# Patient Record
Sex: Male | Born: 1975 | Hispanic: Yes | Marital: Married | Smoking: Current some day smoker
Health system: Southern US, Community
[De-identification: ages and names within clinical notes are randomized; demographics above are authoritative.]

## PROBLEM LIST (undated history)

## (undated) ENCOUNTER — Ambulatory Visit: Payer: Self-pay

---

## 2009-12-21 HISTORY — PX: CHOLECYSTECTOMY: SHX55

## 2011-09-02 ENCOUNTER — Emergency Department: Payer: Self-pay | Admitting: Emergency Medicine

## 2013-11-13 ENCOUNTER — Emergency Department: Payer: Self-pay | Admitting: Emergency Medicine

## 2013-11-13 LAB — COMPREHENSIVE METABOLIC PANEL
Albumin: 3.5 g/dL (ref 3.4–5.0)
Alkaline Phosphatase: 93 U/L
Anion Gap: 1 — ABNORMAL LOW (ref 7–16)
BUN: 19 mg/dL — ABNORMAL HIGH (ref 7–18)
Bilirubin,Total: 0.4 mg/dL (ref 0.2–1.0)
Calcium, Total: 9.2 mg/dL (ref 8.5–10.1)
Chloride: 104 mmol/L (ref 98–107)
Creatinine: 1.07 mg/dL (ref 0.60–1.30)
EGFR (African American): >60
EGFR (Non-African Amer.): >60
Glucose: 90 mg/dL (ref 65–99)
Osmolality: 276 (ref 275–301)
Potassium: 3.9 mmol/L (ref 3.5–5.1)
Sodium: 137 mmol/L (ref 136–145)
Total Protein: 8 g/dL (ref 6.4–8.2)

## 2013-11-13 LAB — CBC
HCT: 44.4 % (ref 40.0–52.0)
MCHC: 33.8 g/dL (ref 32.0–36.0)
MCV: 89 fL (ref 80–100)
Platelet: 316 10*3/uL (ref 150–440)
WBC: 10.2 10*3/uL (ref 3.8–10.6)

## 2015-04-12 ENCOUNTER — Emergency Department
Admit: 2015-04-12 | Disposition: A | Discharge status: Home / Self Care | Payer: Self-pay | Admitting: Emergency Medicine

## 2015-08-09 ENCOUNTER — Emergency Department
Admission: EM | Admit: 2015-08-09 | Discharge: 2015-08-09 | Disposition: A | Discharge status: Home / Self Care | Payer: Self-pay | Attending: Emergency Medicine | Admitting: Emergency Medicine

## 2015-08-09 ENCOUNTER — Other Ambulatory Visit: Payer: Self-pay

## 2015-08-09 ENCOUNTER — Encounter: Payer: Self-pay | Admitting: Emergency Medicine

## 2015-08-09 DIAGNOSIS — R0602 Shortness of breath: Secondary | ICD-10-CM | POA: Insufficient documentation

## 2015-08-09 DIAGNOSIS — E869 Volume depletion, unspecified: Secondary | ICD-10-CM | POA: Insufficient documentation

## 2015-08-09 DIAGNOSIS — Z87891 Personal history of nicotine dependence: Secondary | ICD-10-CM | POA: Insufficient documentation

## 2015-08-09 DIAGNOSIS — R001 Bradycardia, unspecified: Secondary | ICD-10-CM | POA: Insufficient documentation

## 2015-08-09 DIAGNOSIS — R42 Dizziness and giddiness: Secondary | ICD-10-CM | POA: Insufficient documentation

## 2015-08-09 LAB — CBC WITH DIFFERENTIAL/PLATELET
BASOS ABS: 0 10*3/uL (ref 0–0.1)
Basophils Relative: 1 %
EOS ABS: 0.2 10*3/uL (ref 0–0.7)
EOS PCT: 4 %
HCT: 45.2 % (ref 40.0–52.0)
Hemoglobin: 15.2 g/dL (ref 13.0–18.0)
LYMPHS PCT: 26 %
Lymphs Abs: 1.5 10*3/uL (ref 1.0–3.6)
MCH: 30.6 pg (ref 26.0–34.0)
MCHC: 33.6 g/dL (ref 32.0–36.0)
MCV: 90.9 fL (ref 80.0–100.0)
MONO ABS: 0.5 10*3/uL (ref 0.2–1.0)
Monocytes Relative: 8 %
Neutro Abs: 3.6 10*3/uL (ref 1.4–6.5)
Neutrophils Relative %: 61 %
PLATELETS: 232 10*3/uL (ref 150–440)
RBC: 4.97 MIL/uL (ref 4.40–5.90)
RDW: 13.4 % (ref 11.5–14.5)
WBC: 5.8 10*3/uL (ref 3.8–10.6)

## 2015-08-09 LAB — URINALYSIS COMPLETE WITH MICROSCOPIC (ARMC ONLY)
BACTERIA UA: NONE SEEN
Bilirubin Urine: NEGATIVE
GLUCOSE, UA: NEGATIVE mg/dL
Hgb urine dipstick: NEGATIVE
Ketones, ur: NEGATIVE mg/dL
Leukocytes, UA: NEGATIVE
NITRITE: NEGATIVE
Protein, ur: NEGATIVE mg/dL
SQUAMOUS EPITHELIAL / LPF: NONE SEEN
Specific Gravity, Urine: 1.018 (ref 1.005–1.030)
pH: 5 (ref 5.0–8.0)

## 2015-08-09 LAB — BASIC METABOLIC PANEL
ANION GAP: 6 (ref 5–15)
BUN: 14 mg/dL (ref 6–20)
CALCIUM: 9.2 mg/dL (ref 8.9–10.3)
CO2: 28 mmol/L (ref 22–32)
Chloride: 105 mmol/L (ref 101–111)
Creatinine, Ser: 0.96 mg/dL (ref 0.61–1.24)
GFR calc Af Amer: >60 mL/min (ref >60)
GLUCOSE: 95 mg/dL (ref 65–99)
Potassium: 4.2 mmol/L (ref 3.5–5.1)
SODIUM: 139 mmol/L (ref 135–145)

## 2015-08-09 LAB — TROPONIN I

## 2015-08-09 LAB — CK: CK TOTAL: 103 U/L (ref 49–397)

## 2015-08-09 MED ORDER — SODIUM CHLORIDE 0.9 % IV BOLUS (SEPSIS)
1000.0000 mL | INTRAVENOUS | Status: AC
  Administered 2015-08-09: 1000 mL via INTRAVENOUS

## 2015-08-09 NOTE — Discharge Instructions (Signed)
As we discussed, we believe her symptoms are caused today by mild volume depletion, or mild dehydration, without any evidence of damage to your body.  Please drink plenty of clear fluids such as water and/or Gatorade and follow up with your regular doctor or the doctors listed in his documentation at the next available opportunity.  Return to the emergency department with any new or worsening symptoms that concern you, including but not limited to fever, shortness of breath, chest pain, or other concerning symptoms. ° ° °Dehydration, Adult °Dehydration is when you lose more fluids from the body than you take in. Vital organs like the kidneys, brain, and heart cannot function without a proper amount of fluids and salt. Any loss of fluids from the body can cause dehydration.  °CAUSES  °Vomiting. °Diarrhea. °Excessive sweating. °Excessive urine output. °Fever. °SYMPTOMS  °Mild dehydration °Thirst. °Dry lips. °Slightly dry mouth. °Moderate dehydration °Very dry mouth. °Sunken eyes. °Skin does not bounce back quickly when lightly pinched and released. °Dark urine and decreased urine production. °Decreased tear production. °Headache. °Severe dehydration °Very dry mouth. °Extreme thirst. °Rapid, weak pulse (more than 100 beats per minute at rest). °Cold hands and feet. °Not able to sweat in spite of heat and temperature. °Rapid breathing. °Blue lips. °Confusion and lethargy. °Difficulty being awakened. °Minimal urine production. °No tears. °DIAGNOSIS  °Your caregiver will diagnose dehydration based on your symptoms and your exam. Blood and urine tests will help confirm the diagnosis. The diagnostic evaluation should also identify the cause of dehydration. °TREATMENT  °Treatment of mild or moderate dehydration can often be done at home by increasing the amount of fluids that you drink. It is best to drink small amounts of fluid more often. Drinking too much at one time can make vomiting worse. Refer to the home care  instructions below. °Severe dehydration needs to be treated at the hospital where you will probably be given intravenous (IV) fluids that contain water and electrolytes. °HOME CARE INSTRUCTIONS  °Ask your caregiver about specific rehydration instructions. °Drink enough fluids to keep your urine clear or pale yellow. °Drink small amounts frequently if you have nausea and vomiting. °Eat as you normally do. °Avoid: °Foods or drinks high in sugar. °Carbonated drinks. °Juice. °Extremely hot or cold fluids. °Drinks with caffeine. °Fatty, greasy foods. °Alcohol. °Tobacco. °Overeating. °Gelatin desserts. °Wash your hands well to avoid spreading bacteria and viruses. °Only take over-the-counter or prescription medicines for pain, discomfort, or fever as directed by your caregiver. °Ask your caregiver if you should continue all prescribed and over-the-counter medicines. °Keep all follow-up appointments with your caregiver. °SEEK MEDICAL CARE IF: °You have abdominal pain and it increases or stays in one area (localizes). °You have a rash, stiff neck, or severe headache. °You are irritable, sleepy, or difficult to awaken. °You are weak, dizzy, or extremely thirsty. °SEEK IMMEDIATE MEDICAL CARE IF:  °You are unable to keep fluids down or you get worse despite treatment. °You have frequent episodes of vomiting or diarrhea. °You have blood or green matter (bile) in your vomit. °You have blood in your stool or your stool looks black and tarry. °You have not urinated in 6 to 8 hours, or you have only urinated a small amount of very dark urine. °You have a fever. °You faint. °MAKE SURE YOU:  °Understand these instructions. °Will watch your condition. °Will get help right away if you are not doing well or get worse. °Document Released: 12/07/2005 Document Revised: 02/29/2012 Document Reviewed: 07/27/2011 °ExitCare® Patient Information ©2015   [employer]. This information is not intended to replace advice given to you by your health  care provider. Make sure you discuss any questions you have with your health care provider. ° °Rehydration, Adult °Rehydration is the replacement of body fluids lost during dehydration. Dehydration is an extreme loss of body fluids to the point of body function impairment. There are many ways extreme fluid loss can occur, including vomiting, diarrhea, or excess sweating. Recovering from dehydration requires replacing lost fluids, continuing to eat to maintain strength, and avoiding foods and beverages that may contribute to further fluid loss or may increase nausea. °HOW TO REHYDRATE °In most cases, rehydration involves the replacement of not only fluids but also carbohydrates and basic body salts. Rehydration with an oral rehydration solution is one way to replace essential nutrients lost through dehydration. °An oral rehydration solution can be purchased at pharmacies, retail stores, and online. Premixed packets of powder that you combine with water to make a solution are also sold. You can prepare an oral rehydration solution at home by mixing the following ingredients together:  ° - tsp table salt. °¾ tsp baking soda. ° tsp salt substitute containing potassium chloride. °1 tablespoons sugar. °1 L (34 oz) of water. °Be sure to use exact measurements. Including too much sugar can make diarrhea worse. °Drink ½-1 cup (120-240 mL) of oral rehydration solution each time you have diarrhea or vomit. If drinking this amount makes your vomiting worse, try drinking smaller amounts more often. For example, drink 1-3 tsp every 5-10 minutes.  °A general rule for staying hydrated is to drink 1½-2 L of fluid per day. Talk to your caregiver about the specific amount you should be drinking each day. Drink enough fluids to keep your urine clear or pale yellow. °EATING WHEN DEHYDRATED °Even if you have had severe sweating or you are having diarrhea, do not stop eating. Many healthy items in a normal diet are okay to continue eating  while recovering from dehydration. The following tips can help you to lessen nausea when you eat: °Ask someone else to prepare your food. Cooking smells may worsen nausea. °Eat in a well-ventilated room away from cooking smells. °Sit up when you eat. Avoid lying down until 1-2 hours after eating. °Eat small amounts when you eat. °Eat foods that are easy to digest. These include soft, well-cooked, or mashed foods. °FOODS AND BEVERAGES TO AVOID °Avoid eating or drinking the following foods and beverages that may increase nausea or further loss of fluid:  °Fruit juices with a high sugar content, such as concentrated juices. °Alcohol. °Beverages containing caffeine. °Carbonated drinks. They may cause a lot of gas. °Foods that may cause a lot of gas, such as cabbage, broccoli, and beans. °Fatty, greasy, and fried foods. °Spicy, very salty, and very sweet foods or drinks. °Foods or drinks that are very hot or very cold. Consume food or drinks at or near room temperature. °Foods that need a lot of chewing, such as raw vegetables. °Foods that are sticky or hard to swallow, such as peanut butter. °Document Released: 02/29/2012 Document Revised: 08/31/2012 Document Reviewed: 02/29/2012 °ExitCare® Patient Information ©2015 [employer]. This information is not intended to replace advice given to you by your health care provider. Make sure you discuss any questions you have with your health care provider. ° ° ° °

## 2015-08-09 NOTE — ED Notes (Signed)
Pt presents with feelings of dizziness. Thinks he might be dehydrated, he works outside in [employer].

## 2015-08-09 NOTE — ED Provider Notes (Signed)
[facility] Emergency Department Provider Note  ____________________________________________  Time seen: Approximately 8:10 AM  I have reviewed the triage vital signs and the nursing notes.   HISTORY  Chief Complaint Dizziness    HPI Scott Winters is a 39 y.o. male with no chronic medical problems who presents with several days of intermittent lightheadedness/dizziness, a near syncopal episode 3 days ago, and general fatigue.  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  He has never had these kinds of symptoms before but associates it with his work even though he tries to drink plenty of water while he is outside.  He denies any acute muscle cramps but does state that he has had some pain in his right upper quadrant intermittently.  However, he has had this pain since having his gallbladder out in 2011 and states that it is not anything new.  Hedenies headache, neck pain, chest pain, abdominal pain, vomiting, diarrhea.  Associated with the lightheadedness he has had some mild shortness of breath while outside in the heat and working.  He has had no numbness or weakness in any of his extremities.   History reviewed. No pertinent past medical history.  There are no active problems to display for this patient.   Past Surgical History  Procedure Laterality Date  . Cholecystectomy  2011    No current outpatient prescriptions on file.  Allergies Review of patient's allergies indicates no known allergies.  History reviewed. No pertinent family history.  Social History Social History  Substance Use Topics  . Smoking status: Former Games developer  . Smokeless tobacco: None  . Alcohol Use: Yes    Review of Systems Constitutional: No fever/chills Eyes: No visual changes. ENT: No sore throat. Cardiovascular: Denies chest pain.  One episode of nearly passing out 3 days ago Respiratory: Shortness of breath  associated with the lightheadedness Gastrointestinal: No abdominal pain.  No nausea, no vomiting.  No diarrhea.  No constipation. Genitourinary: Negative for dysuria. Musculoskeletal: Negative for back pain. Skin: Negative for rash. Neurological: Negative for headaches, focal weakness or numbness.  One episode of nearly passing out 3 days ago  10-point ROS otherwise negative.  ____________________________________________   PHYSICAL EXAM:  VITAL SIGNS: ED Triage Vitals  Enc Vitals Group     BP 08/09/15 0709 120/84 mmHg     Pulse Rate 08/09/15 0709 71     Resp 08/09/15 0709 18     Temp 08/09/15 0709 97.8 F (36.6 C)     Temp Source 08/09/15 0709 Oral     SpO2 08/09/15 0709 98 %     Weight 08/09/15 0709 220 lb (99.791 kg)     Height 08/09/15 0709 6' (1.829 m)     Head Cir --      Peak Flow --      Pain Score --      Pain Loc --      Pain Edu? --      Excl. in GC? --     Constitutional: Alert and oriented. Well appearing and in no acute distress. Eyes: Conjunctivae are normal. PERRL. EOMI. Head: Atraumatic. Nose: No congestion/rhinnorhea. Mouth/Throat: Mucous membranes are moist.  Oropharynx non-erythematous. Neck: No stridor.   Cardiovascular: Slight bradycardia, regular rhythm. Grossly normal heart sounds.  Good peripheral circulation. Respiratory: Normal respiratory effort.  No retractions. Lungs CTAB. Gastrointestinal: Soft and nontender. No distention. No abdominal bruits. No CVA tenderness. Musculoskeletal: No lower extremity tenderness nor edema.  No joint effusions. Neurologic:  Normal speech and language. No gross focal neurologic deficits are appreciated.  Normal gait. Skin:  Skin is warm, dry and intact. No rash noted. Psychiatric: Mood and affect are normal. Speech and behavior are normal.  ____________________________________________   LABS (all labs ordered are listed, but only abnormal results are displayed)  Labs Reviewed  URINALYSIS COMPLETEWITH  MICROSCOPIC ([facility] ONLY) - Abnormal; Notable for the following:    Color, Urine YELLOW (*)    APPearance CLEAR (*)    All other components within normal limits  CBC WITH DIFFERENTIAL/PLATELET  BASIC METABOLIC PANEL  CK  TROPONIN I   ____________________________________________  EKG  ED ECG REPORT I, Charish Schroepfer, the attending physician, personally viewed and interpreted this ECG.  Date: 08/09/2015 EKG Time: 8:08 Rate: 57 Rhythm: Sinus bradycardia QRS Axis: normal Intervals: normal ST/T Wave abnormalities: normal Conduction Disutrbances: none Narrative Interpretation: unremarkable  ____________________________________________  RADIOLOGY  Not indicated  ____________________________________________   PROCEDURES  Procedure(s) performed: None  Critical Care performed: No ____________________________________________   INITIAL IMPRESSION / ASSESSMENT AND PLAN / ED COURSE  Pertinent labs & imaging results that were available during my care of the patient were reviewed by me and considered in my medical decision making (see chart for details).  The patient is very well-appearing and in no acute distress.  I believe the symptoms are most likely related to volume depletion and working out in the heat.  I am checking electrolytes, CK, troponin, and urinalysis.  I will give him a fluid bolus and reassess.  There are no signs or symptoms that he has currently, especially in the setting of a normal appearing EKG, that make me concerned about an emergent medical process.  ----------------------------------------- 9:56 AM on 08/09/2015 -----------------------------------------  Patient says he feels better after a liter of fluids.  I discussed with him his reassuring lab work and EKG.  We talked about water and Gatorade for hydration and I gave my usual and customary return precautions.  He understands and agrees with the  plan.  ____________________________________________  FINAL CLINICAL IMPRESSION(S) / ED DIAGNOSES  Final diagnoses:  Volume depletion  Lightheadedness      NEW MEDICATIONS STARTED DURING THIS VISIT:  New Prescriptions   No medications on file     Loleta Rose, MD 08/09/15 [phone]

## 2016-09-30 ENCOUNTER — Encounter: Payer: Self-pay | Admitting: *Deleted

## 2016-09-30 ENCOUNTER — Emergency Department
Admission: EM | Admit: 2016-09-30 | Discharge: 2016-09-30 | Disposition: A | Discharge status: Home / Self Care | Payer: Self-pay | Attending: Emergency Medicine | Admitting: Emergency Medicine

## 2016-09-30 DIAGNOSIS — K2970 Gastritis, unspecified, without bleeding: Secondary | ICD-10-CM | POA: Insufficient documentation

## 2016-09-30 DIAGNOSIS — Z87891 Personal history of nicotine dependence: Secondary | ICD-10-CM | POA: Insufficient documentation

## 2016-09-30 LAB — URINALYSIS COMPLETE WITH MICROSCOPIC (ARMC ONLY)
Bacteria, UA: NONE SEEN
Bilirubin Urine: NEGATIVE
Glucose, UA: NEGATIVE mg/dL
HGB URINE DIPSTICK: NEGATIVE
Ketones, ur: NEGATIVE mg/dL
LEUKOCYTES UA: NEGATIVE
Nitrite: NEGATIVE
PH: 5 (ref 5.0–8.0)
PROTEIN: NEGATIVE mg/dL
SQUAMOUS EPITHELIAL / LPF: NONE SEEN
Specific Gravity, Urine: 1.026 (ref 1.005–1.030)

## 2016-09-30 LAB — CBC
HEMATOCRIT: 47.1 % (ref 40.0–52.0)
HEMOGLOBIN: 16.4 g/dL (ref 13.0–18.0)
MCH: 31.5 pg (ref 26.0–34.0)
MCHC: 34.7 g/dL (ref 32.0–36.0)
MCV: 90.7 fL (ref 80.0–100.0)
Platelets: 258 10*3/uL (ref 150–440)
RBC: 5.2 MIL/uL (ref 4.40–5.90)
RDW: 13 % (ref 11.5–14.5)
WBC: 7 10*3/uL (ref 3.8–10.6)

## 2016-09-30 LAB — COMPREHENSIVE METABOLIC PANEL
ALBUMIN: 4.1 g/dL (ref 3.5–5.0)
ALT: 30 U/L (ref 17–63)
ANION GAP: 7 (ref 5–15)
AST: 36 U/L (ref 15–41)
Alkaline Phosphatase: 70 U/L (ref 38–126)
BILIRUBIN TOTAL: 0.7 mg/dL (ref 0.3–1.2)
BUN: 18 mg/dL (ref 6–20)
CALCIUM: 9.7 mg/dL (ref 8.9–10.3)
CO2: 27 mmol/L (ref 22–32)
Chloride: 106 mmol/L (ref 101–111)
Creatinine, Ser: 1.15 mg/dL (ref 0.61–1.24)
GFR calc non Af Amer: >60 mL/min (ref >60)
GLUCOSE: 92 mg/dL (ref 65–99)
POTASSIUM: 3.9 mmol/L (ref 3.5–5.1)
Sodium: 140 mmol/L (ref 135–145)
TOTAL PROTEIN: 7.5 g/dL (ref 6.5–8.1)

## 2016-09-30 LAB — LIPASE, BLOOD: Lipase: 28 U/L (ref 11–51)

## 2016-09-30 MED ORDER — SUCRALFATE 1 G PO TABS: 1.0000 g | ORAL_TABLET | Freq: Four times a day (QID) | ORAL | 0 refills | Status: DC

## 2016-09-30 MED ORDER — GI COCKTAIL ~~LOC~~
30.0000 mL | Freq: Once | ORAL | Status: AC
  Administered 2016-09-30: 30 mL via ORAL
  Filled 2016-09-30: qty 30

## 2016-09-30 MED ORDER — RANITIDINE HCL 150 MG PO TABS: 150.0000 mg | ORAL_TABLET | Freq: Two times a day (BID) | ORAL | 1 refills | Status: DC

## 2016-09-30 NOTE — ED Triage Notes (Signed)
Pt arrived to ED reporting sharp epigastric and upper quadrant abd pain that began Sat. And has worsened today. Pt reports having nausea but also stated that food has helped to decrease pain. Pt reports water increases pain. Pt reports feeling weaker than normal and verbalized having "shakiness"  Today as well. No changes in BM or urinary symptoms.

## 2016-09-30 NOTE — ED Provider Notes (Signed)
[facility] Emergency Department Provider Note    ____________________________________________   I have reviewed the triage vital signs and the nursing notes.   HISTORY  Chief Complaint Abdominal Pain   History limited by: Not Limited   HPI Scott Stairsngel Winters is a 40 y.o. male who presents to the emergency department today because of concerns for abdominal pain.It started 4 days ago. It is located in the epigastric region. He describes it as sharp. It has waxed and waned however has been consistently present. Currently he rates it an 8 out of 10. He states it is worse slightly after eating. He was taking aleive last week. He [place] but does not recall the reason why. He denies any fevers. Some nausea but no vomiting.   No past medical history on file.  There are no active problems to display for this patient.   Past Surgical History:  Procedure Laterality Date  . CHOLECYSTECTOMY  2011    Prior to Admission medications   Not on File    Allergies Review of patient's allergies indicates no known allergies.  No family history on file.  Social History Social History  Substance Use Topics  . Smoking status: Former Games developermoker  . Smokeless tobacco: Never Used  . Alcohol use Yes     Comment: 2-3 beers per day.     Review of Systems  Constitutional: Negative for fever. Cardiovascular: Negative for chest pain. Respiratory: Negative for shortness of breath. Gastrointestinal: Positive for epigastric abdominal pain. Genitourinary: Negative for dysuria. Musculoskeletal: Negative for back pain. Skin: Negative for rash. Neurological: Negative for headaches, focal weakness or numbness.  10-point ROS otherwise negative.  ____________________________________________   PHYSICAL EXAM:  VITAL SIGNS: ED Triage Vitals  Enc Vitals Group     BP 09/30/16 1603 118/69     Pulse Rate 09/30/16 1603 70      Resp 09/30/16 1603 16     Temp 09/30/16 1603 98 F (36.7 C)     Temp Source 09/30/16 1603 Oral     SpO2 09/30/16 1603 100 %     Weight 09/30/16 1605 204 lb (92.5 kg)     Height 09/30/16 1605 6' (1.829 m)     Head Circumference --      Peak Flow --      Pain Score 09/30/16 1605 7   Constitutional: Alert and oriented. Well appearing and in no distress. Eyes: Conjunctivae are normal. Normal extraocular movements. ENT   Head: Normocephalic and atraumatic.   Nose: No congestion/rhinnorhea.   Mouth/Throat: Mucous membranes are moist.   Neck: No stridor. Hematological/Lymphatic/Immunilogical: No cervical lymphadenopathy. Cardiovascular: Normal rate, regular rhythm.  No murmurs, rubs, or gallops. Respiratory: Normal respiratory effort without tachypnea nor retractions. Breath sounds are clear and equal bilaterally. No wheezes/rales/rhonchi. Gastrointestinal: Soft and tender to palpation in the epigastric region. Genitourinary: Deferred Musculoskeletal: Normal range of motion in all extremities. No lower extremity edema. Neurologic:  Normal speech and language. No gross focal neurologic deficits are appreciated.  Skin:  Skin is warm, dry and intact. No rash noted. Psychiatric: Mood and affect are normal. Speech and behavior are normal. Patient exhibits appropriate insight and judgment.  ____________________________________________    LABS (pertinent positives/negatives)  Labs Reviewed  URINALYSIS COMPLETEWITH MICROSCOPIC ([facility] ONLY) - Abnormal; Notable for the following:       Result Value   Color, Urine YELLOW (*)    APPearance CLEAR (*)    All other components  within normal limits  LIPASE, BLOOD  COMPREHENSIVE METABOLIC PANEL  CBC     ____________________________________________   EKG  None  ____________________________________________     RADIOLOGY  None  ____________________________________________   PROCEDURES  Procedures  ____________________________________________   INITIAL IMPRESSION / ASSESSMENT AND PLAN / ED COURSE  Pertinent labs & imaging results that were available during my care of the patient were reviewed by me and considered in my medical decision making (see chart for details).  Work up without any concerning findings. Patient status post cholecystectomy. He did feel better after GI cocktail. Think likely gastritis, especially given history of OTC pain medication. Will discharge with sucralfate and antacid. Discussed importance of establishing and following up with primary care physician. ____________________________________________   FINAL CLINICAL IMPRESSION(S) / ED DIAGNOSES  Final diagnoses:  Gastritis without bleeding, unspecified chronicity, unspecified gastritis type     Note: This dictation was prepared with Dragon dictation. Any transcriptional errors that result from this process are unintentional    Phineas Semen, MD 09/30/16 2006

## 2016-09-30 NOTE — Discharge Instructions (Signed)
Please seek medical attention for any high fevers, chest pain, shortness of breath, change in behavior, persistent vomiting, bloody stool or any other new or concerning symptoms.  

## 2017-01-24 ENCOUNTER — Emergency Department
Admission: EM | Admit: 2017-01-24 | Discharge: 2017-01-24 | Disposition: A | Discharge status: Home / Self Care | Payer: Self-pay | Attending: Emergency Medicine | Admitting: Emergency Medicine

## 2017-01-24 DIAGNOSIS — Z87891 Personal history of nicotine dependence: Secondary | ICD-10-CM | POA: Insufficient documentation

## 2017-01-24 DIAGNOSIS — J111 Influenza due to unidentified influenza virus with other respiratory manifestations: Secondary | ICD-10-CM | POA: Insufficient documentation

## 2017-01-24 MED ORDER — NAPROXEN 500 MG PO TABS: 500.0000 mg | ORAL_TABLET | Freq: Two times a day (BID) | ORAL | 0 refills | Status: AC

## 2017-01-24 MED ORDER — GUAIFENESIN-CODEINE 100-10 MG/5ML PO SYRP: 5.0000 mL | ORAL_SOLUTION | Freq: Three times a day (TID) | ORAL | 0 refills | Status: AC | PRN

## 2017-01-24 MED ORDER — IBUPROFEN 600 MG PO TABS
600.0000 mg | ORAL_TABLET | Freq: Once | ORAL | Status: AC
  Administered 2017-01-24: 600 mg via ORAL
  Filled 2017-01-24: qty 1

## 2017-01-24 MED ORDER — TRAMADOL HCL 50 MG PO TABS
50.0000 mg | ORAL_TABLET | Freq: Once | ORAL | Status: AC
  Administered 2017-01-24: 50 mg via ORAL
  Filled 2017-01-24: qty 1

## 2017-01-24 NOTE — ED Triage Notes (Signed)
Pt states that he started feeling bad on Wednesday with cough, congestion, and fever, pt states that he has cont to feel worse, pt reports taking thera flu prior to arrival

## 2017-01-24 NOTE — ED Provider Notes (Signed)
[facility] Emergency Department Provider Note  ____________________________________________  Time seen: Approximately 9:32 PM  I have reviewed the triage vital signs and the nursing notes.   HISTORY  Chief Complaint Fever    HPI Scott Winters is a 41 y.o. male the emergency department with continued headache, congestion, non productive cough, body achessince Wednesday. Patient states that body aches have continued to get worse. Patient has had a couple episodes of diarrhea since yesterday. Patient is eating and drinking well. Patient did not receive flu shot this year. Patient has taken TheraFlu flu without relief. Patient denies shortness of breath, chest pain, nausea, vomiting, abdominal pain,  constipation.   No past medical history on file.  There are no active problems to display for this patient.   Past Surgical History:  Procedure Laterality Date  . CHOLECYSTECTOMY  2011    Prior to Admission medications   Medication Sig Start Date End Date Taking? Authorizing Provider  guaiFENesin-codeine (ROBITUSSIN AC) 100-10 MG/5ML syrup Take 5 mLs by mouth 3 (three) times daily as needed for cough. 01/24/17 01/26/17  Enid Derry, PA-C  naproxen (NAPROSYN) 500 MG tablet Take 1 tablet (500 mg total) by mouth 2 (two) times daily with a meal. 01/24/17 01/24/18  Enid Derry, PA-C  ranitidine (ZANTAC) 150 MG tablet Take 1 tablet (150 mg total) by mouth 2 (two) times daily. 09/30/16 09/30/17  Phineas Semen, MD  sucralfate (CARAFATE) 1 g tablet Take 1 tablet (1 g total) by mouth 4 (four) times daily. 09/30/16   Phineas Semen, MD    Allergies Patient has no known allergies.  No family history on file.  Social History Social History  Substance Use Topics  . Smoking status: Former Games developer  . Smokeless tobacco: Never Used  . Alcohol use Yes     Comment: 2-3 beers per day.      Review of Systems  Constitutional: No fever/chills Eyes: No visual changes. No  discharge. ENT: Positive for congestion and rhinorrhea. Cardiovascular: No chest pain. Respiratory: Positive for cough. No SOB. Gastrointestinal: No abdominal pain.  No nausea, no vomiting.  No constipation. Skin: Negative for rash, abrasions, lacerations, ecchymosis.   ____________________________________________   PHYSICAL EXAM:  VITAL SIGNS: ED Triage Vitals [01/24/17 1935]  Enc Vitals Group     BP 123/86     Pulse Rate 98     Resp 20     Temp 99.3 F (37.4 C)     Temp Source Oral     SpO2 98 %     Weight 205 lb (93 kg)     Height 6' (1.829 m)     Head Circumference      Peak Flow      Pain Score 8     Pain Loc      Pain Edu?      Excl. in GC?      Constitutional: Alert and oriented. Well appearing and in no acute distress. Eyes: Conjunctivae are normal. PERRL. EOMI. No discharge. Head: Atraumatic. ENT: No frontal and maxillary sinus tenderness.      Ears: Tympanic membranes pearly gray with good landmarks. No discharge.      Nose: Mild congestion/rhinnorhea.      Mouth/Throat: Mucous membranes are moist. Oropharynx non-erythematous. Tonsils not enlarged. No exudates. Uvula midline. Neck: No stridor.   Hematological/Lymphatic/Immunilogical: No cervical lymphadenopathy. Cardiovascular: Normal rate, regular rhythm. Good peripheral circulation. Respiratory: Normal respiratory effort without tachypnea or retractions. Lungs CTAB. Good air entry to the bases with no decreased  or absent breath sounds. Gastrointestinal: Bowel sounds 4 quadrants. Soft and nontender to palpation. No guarding or rigidity. No palpable masses. No distention. Musculoskeletal: Full range of motion to all extremities. No gross deformities appreciated. Neurologic:  Normal speech and language. No gross focal neurologic deficits are appreciated.  Skin:  Skin is warm, dry and intact. No rash noted. Psychiatric: Mood and affect are normal. Speech and behavior are normal. Patient exhibits appropriate  insight and judgement.   ____________________________________________   LABS (all labs ordered are listed, but only abnormal results are displayed)  Labs Reviewed - No data to display ____________________________________________  EKG   ____________________________________________  RADIOLOGY   No results found.  ____________________________________________    PROCEDURES  Procedure(s) performed:    Procedures    Medications  ibuprofen (ADVIL,MOTRIN) tablet 600 mg (600 mg Oral Given 01/24/17 2114)  traMADol (ULTRAM) tablet 50 mg (50 mg Oral Given 01/24/17 2211)     ____________________________________________   INITIAL IMPRESSION / ASSESSMENT AND PLAN / ED COURSE  Pertinent labs & imaging results that were available during my care of the patient were reviewed by me and considered in my medical decision making (see chart for details).  Review of the [place] CSRS was performed in accordance of the [facility] prior to dispensing any controlled drugs.     Patient's diagnosis is consistent with influenza. Vital signs and exam are reassuring. Patient is outside of the window to receive Tamiflu. Education was provided. Patient received ibuprofen in ED for fever and tramadol for headache. Symptoms improved after treatment. Patient will be discharged home with prescriptions for Robitussin and naproxen. Patient is to follow up with PCP as needed or otherwise directed. Patient is given ED precautions to return to the ED for any worsening or new symptoms.     ____________________________________________  FINAL CLINICAL IMPRESSION(S) / ED DIAGNOSES  Final diagnoses:  Influenza      NEW MEDICATIONS STARTED DURING THIS VISIT:  Discharge Medication List as of 01/24/2017  9:52 PM    START taking these medications   Details  guaiFENesin-codeine (ROBITUSSIN AC) 100-10 MG/5ML syrup Take 5 mLs by mouth 3 (three) times daily as needed for cough., Starting Sun 01/24/2017, Until Tue  01/26/2017, Print    naproxen (NAPROSYN) 500 MG tablet Take 1 tablet (500 mg total) by mouth 2 (two) times daily with a meal., Starting Sun 01/24/2017, Until Mon 01/24/2018, Print            This chart was dictated using voice recognition software/Dragon. Despite best efforts to proofread, errors can occur which can change the meaning. Any change was purely unintentional.    Enid DerryAshley Jesyka Slaght, PA-C 01/25/17 0011    Nita Sicklearolina Veronese, MD 01/26/17 [phone]

## 2017-11-20 ENCOUNTER — Emergency Department: Payer: Self-pay

## 2017-11-20 ENCOUNTER — Emergency Department
Admission: EM | Admit: 2017-11-20 | Discharge: 2017-11-20 | Disposition: A | Discharge status: Home / Self Care | Payer: Self-pay | Attending: Emergency Medicine | Admitting: Emergency Medicine

## 2017-11-20 DIAGNOSIS — Z79899 Other long term (current) drug therapy: Secondary | ICD-10-CM | POA: Insufficient documentation

## 2017-11-20 DIAGNOSIS — Z87891 Personal history of nicotine dependence: Secondary | ICD-10-CM | POA: Insufficient documentation

## 2017-11-20 DIAGNOSIS — H68002 Unspecified Eustachian salpingitis, left ear: Secondary | ICD-10-CM | POA: Insufficient documentation

## 2017-11-20 MED ORDER — FEXOFENADINE-PSEUDOEPHED ER 60-120 MG PO TB12: 1.0000 | ORAL_TABLET | Freq: Two times a day (BID) | ORAL | 0 refills | Status: DC

## 2017-11-20 NOTE — ED Provider Notes (Signed)
[facility] Emergency Department Provider Note   ____________________________________________   First MD Initiated Contact with Patient 11/20/17 1702     (approximate)  I have reviewed the triage vital signs and the nursing notes.   HISTORY  Chief Complaint Otalgia and Headache    HPI Scott Winters is a 41 y.o. male patient complaining of 2 days of increasing left-sided headache, upper neck pain, and and left ear pain. Patient also states decreased hearing. Patient denies vertigo, vision loss or weakness. Patient's stay his blood pressures elevated last couple days with no history of hypertension. Patient state even though he was normotensive at triage his blood pressure still elevated more then normal readings.  No past medical history on file.  There are no active problems to display for this patient.   Past Surgical History:  Procedure Laterality Date  . CHOLECYSTECTOMY  2011    Prior to Admission medications   Medication Sig Start Date End Date Taking? Authorizing Provider  fexofenadine-pseudoephedrine (ALLEGRA-D) 60-120 MG 12 hr tablet Take 1 tablet by mouth 2 (two) times daily. 11/20/17   Joni ReiningSmith, Anzlee Hinesley K, PA-C  naproxen (NAPROSYN) 500 MG tablet Take 1 tablet (500 mg total) by mouth 2 (two) times daily with a meal. 01/24/17 01/24/18  Enid DerryWagner, Ashley, PA-C  ranitidine (ZANTAC) 150 MG tablet Take 1 tablet (150 mg total) by mouth 2 (two) times daily. 09/30/16 09/30/17  Phineas SemenGoodman, Graydon, MD  sucralfate (CARAFATE) 1 g tablet Take 1 tablet (1 g total) by mouth 4 (four) times daily. 09/30/16   Phineas SemenGoodman, Graydon, MD    Allergies Patient has no known allergies.  No family history on file.  Social History Social History   Tobacco Use  . Smoking status: Former Games developermoker  . Smokeless tobacco: Never Used  Substance Use Topics  . Alcohol use: Yes    Comment: 1 beer per day  . Drug use: No    Review of Systems Constitutional: No fever/chills Eyes: No  visual changes. ENT: No sore throat. Left ear pain with decreased hearing Cardiovascular: Denies chest pain. Respiratory: Denies shortness of breath. Gastrointestinal: No abdominal pain.  No nausea, no vomiting.  No diarrhea.  No constipation. Genitourinary: Negative for dysuria. Musculoskeletal: Negative for back pain. Skin: Negative for rash. Neurological: Positive for headaches, but denies focal weakness or numbness.   ____________________________________________   PHYSICAL EXAM:  VITAL SIGNS: ED Triage Vitals  Enc Vitals Group     BP 11/20/17 1620 (!) 144/87     Pulse Rate 11/20/17 1620 82     Resp 11/20/17 1620 18     Temp 11/20/17 1620 98.3 F (36.8 C)     Temp Source 11/20/17 1620 Oral     SpO2 11/20/17 1620 98 %     Weight 11/20/17 1620 205 lb (93 kg)     Height --      Head Circumference --      Peak Flow --      Pain Score 11/20/17 1619 7     Pain Loc --      Pain Edu? --      Excl. in GC? --     Constitutional: Alert and oriented. Well appearing and in no acute distress. Eyes: Conjunctivae are normal. PERRL. EOMI. Head: Atraumatic. Nose: No congestion/rhinnorhea. Left maxillary guarding. EARS: No foreign body cerumen impaction. Edematous left TM. Mouth/Throat: Mucous membranes are moist.  Oropharynx non-erythematous. Neck: No stridor.  No cervical spine tenderness to palpation. Cardiovascular: Normal rate, regular rhythm. Grossly normal  heart sounds.  Good peripheral circulation. Respiratory: Normal respiratory effort.  No retractions. Lungs CTAB. Gastrointestinal: Soft and nontender. No distention. No abdominal bruits. No CVA tenderness. Musculoskeletal: No lower extremity tenderness nor edema.  No joint effusions. Neurologic:  Normal speech and language. No gross focal neurologic deficits are appreciated. No gait instability. Skin:  Skin is warm, dry and intact. No rash noted. Psychiatric: Mood and affect are normal. Speech and behavior are  normal.  ____________________________________________   LABS (all labs ordered are listed, but only abnormal results are displayed)  Labs Reviewed - No data to display ____________________________________________  EKG   ____________________________________________  RADIOLOGY  Ct Head Wo Contrast  Result Date: 11/20/2017 CLINICAL DATA:  One-day history of headache. EXAM: CT HEAD WITHOUT CONTRAST TECHNIQUE: Contiguous axial images were obtained from the base of the skull through the vertex without intravenous contrast. COMPARISON:  None. FINDINGS: Brain: There is no evidence for acute hemorrhage, hydrocephalus, mass lesion, or abnormal extra-axial fluid collection. No definite CT evidence for acute infarction. Vascular: No hyperdense vessel or unexpected calcification. Skull: No evidence for fracture. No worrisome lytic or sclerotic lesion. Sinuses/Orbits: The visualized paranasal sinuses and mastoid air cells are clear. Visualized portions of the globes and intraorbital fat are unremarkable. Other: None. IMPRESSION: 1. No acute intracranial abnormality. Electronically Signed   By: Kennith CenterEric  Mansell M.D.   On: 11/20/2017 17:47    ____________________________________________   PROCEDURES  Procedure(s) performed: None  Procedures  Critical Care performed: No  ____________________________________________   INITIAL IMPRESSION / ASSESSMENT AND PLAN / ED COURSE  As part of my medical decision making, I reviewed the following data within the electronic MEDICAL RECORD NUMBER  Eustachian tube dysfunction. Patient present with left head pain and left ear pain for 2 days. Patient also states decreased hearing loss. Patient also was concern of elevated blood pressure. Discussed negative findings CT scan of the head. Patient given discharge care instructions and advised take medication as directed. Patient [facility]. Return back to ED if  condition worsens       ____________________________________________   FINAL CLINICAL IMPRESSION(S) / ED DIAGNOSES  Final diagnoses:  Eustachian catarrh, left     ED Discharge Orders        Ordered    fexofenadine-pseudoephedrine (ALLEGRA-D) 60-120 MG 12 hr tablet  2 times daily     11/20/17 1754       Note:  This document was prepared using Dragon voice recognition software and may include unintentional dictation errors.    Joni ReiningSmith, Lorriane Dehart K, PA-C 11/20/17 Laqueta [id number]    Goodman, Graydon, MD 11/20/17 [phone]

## 2017-11-20 NOTE — ED Notes (Signed)
See triage note  Presents with pain and decreased hearing to left ear  sxs' started about 2 days ago  States he tried to clean his ears with a q-tip  And ear started to bleed.also now having headache

## 2017-11-20 NOTE — ED Triage Notes (Signed)
Pt comes in complaining of L sided head pain and L ear pain.  Pt reports that he cannot hear out of his L ear very well.  Pt also reports that his neck has been hurting as well.  Per pt at home, his BP has been elevated with no history.  Pt A&Ox4, in NAD, ambulatory from triage.

## 2017-11-20 NOTE — ED Triage Notes (Signed)
Patient noted afebrile, blood pressure not markedly elevated, not requiring protocols. Moved to flex wait

## 2018-07-07 ENCOUNTER — Encounter: Payer: Self-pay | Admitting: Emergency Medicine

## 2018-07-07 ENCOUNTER — Emergency Department: Payer: Self-pay

## 2018-07-07 ENCOUNTER — Emergency Department
Admission: EM | Admit: 2018-07-07 | Discharge: 2018-07-07 | Disposition: A | Discharge status: Home / Self Care | Payer: Self-pay | Attending: Emergency Medicine | Admitting: Emergency Medicine

## 2018-07-07 DIAGNOSIS — Z87891 Personal history of nicotine dependence: Secondary | ICD-10-CM | POA: Insufficient documentation

## 2018-07-07 DIAGNOSIS — T675XXA Heat exhaustion, unspecified, initial encounter: Secondary | ICD-10-CM

## 2018-07-07 DIAGNOSIS — E860 Dehydration: Secondary | ICD-10-CM

## 2018-07-07 LAB — CBC
HCT: 45 % (ref 40.0–52.0)
Hemoglobin: 15.9 g/dL (ref 13.0–18.0)
MCH: 31.8 pg (ref 26.0–34.0)
MCHC: 35.2 g/dL (ref 32.0–36.0)
MCV: 90.3 fL (ref 80.0–100.0)
PLATELETS: 286 10*3/uL (ref 150–440)
RBC: 4.99 MIL/uL (ref 4.40–5.90)
RDW: 13.2 % (ref 11.5–14.5)
WBC: 10.7 10*3/uL — AB (ref 3.8–10.6)

## 2018-07-07 LAB — BASIC METABOLIC PANEL
ANION GAP: 12 (ref 5–15)
BUN: 29 mg/dL — ABNORMAL HIGH (ref 6–20)
CALCIUM: 10 mg/dL (ref 8.9–10.3)
CO2: 22 mmol/L (ref 22–32)
CREATININE: 1.54 mg/dL — AB (ref 0.61–1.24)
Chloride: 103 mmol/L (ref 98–111)
GFR, EST NON AFRICAN AMERICAN: 54 mL/min — AB (ref >60)
Glucose, Bld: 111 mg/dL — ABNORMAL HIGH (ref 70–99)
Potassium: 4 mmol/L (ref 3.5–5.1)
SODIUM: 137 mmol/L (ref 135–145)

## 2018-07-07 LAB — URINALYSIS, COMPLETE (UACMP) WITH MICROSCOPIC
BILIRUBIN URINE: NEGATIVE
Bacteria, UA: NONE SEEN
Glucose, UA: NEGATIVE mg/dL
HGB URINE DIPSTICK: NEGATIVE
KETONES UR: 5 mg/dL — AB
LEUKOCYTES UA: NEGATIVE
Nitrite: NEGATIVE
PROTEIN: NEGATIVE mg/dL
Specific Gravity, Urine: 1.026 (ref 1.005–1.030)
pH: 5 (ref 5.0–8.0)

## 2018-07-07 LAB — CK: Total CK: 389 U/L (ref 49–397)

## 2018-07-07 LAB — TROPONIN I

## 2018-07-07 MED ORDER — ONDANSETRON HCL 4 MG/2ML IJ SOLN
4.0000 mg | Freq: Once | INTRAMUSCULAR | Status: AC
  Administered 2018-07-07: 4 mg via INTRAVENOUS
  Filled 2018-07-07: qty 2

## 2018-07-07 MED ORDER — SODIUM CHLORIDE 0.9 % IV BOLUS
1000.0000 mL | Freq: Once | INTRAVENOUS | Status: AC
  Administered 2018-07-07: 1000 mL via INTRAVENOUS

## 2018-07-07 NOTE — ED Provider Notes (Signed)
[facility] Emergency Department Provider Note  ____________________________________________  Time seen: Approximately 7:08 PM  I have reviewed the triage vital signs and the nursing notes.   HISTORY  Chief Complaint Chest Pain   HPI Scott Winters is a 42 y.o. male no significant past medical history who presents for concerns of dehydration.  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  He reports that he has been trying to keep up with his drinking by drinking lots of water however his urine has been very dark and patient has been having severe intermittent generalized cramping.  Patient reports cramping in his chest, abdomen, flank, and extremities.  He denies headache, chest pain, shortness of breath, fever, chills, dysuria, hematuria, vomiting.  He does endorse feeling nauseated.  He has no abdominal pain. He is complaining of having very dry mouth as well.   Past Surgical History:  Procedure Laterality Date  . CHOLECYSTECTOMY  2011    Prior to Admission medications   Medication Sig Start Date End Date Taking? Authorizing Provider  fexofenadine-pseudoephedrine (ALLEGRA-D) 60-120 MG 12 hr tablet Take 1 tablet by mouth 2 (two) times daily. 11/20/17   Joni Reining, PA-C  ranitidine (ZANTAC) 150 MG tablet Take 1 tablet (150 mg total) by mouth 2 (two) times daily. 09/30/16 09/30/17  Phineas Semen, MD  sucralfate (CARAFATE) 1 g tablet Take 1 tablet (1 g total) by mouth 4 (four) times daily. 09/30/16   Phineas Semen, MD    Allergies Patient has no known allergies.  No family history on file.  Social History Social History   Tobacco Use  . Smoking status: Former Games developer  . Smokeless tobacco: Never Used  Substance Use Topics  . Alcohol use: Yes    Comment: 1 beer per day  . Drug use: No    Review of Systems  Constitutional: Negative for fever. Eyes: Negative for visual  changes. ENT: Negative for sore throat. + dry mouth Neck: No neck pain  Cardiovascular: Negative for chest pain. Respiratory: Negative for shortness of breath. Gastrointestinal: Negative for abdominal pain, vomiting or diarrhea. Genitourinary: Negative for dysuria. + dark urine Musculoskeletal: Negative for back pain. + generalized cramping Skin: Negative for rash. Neurological: Negative for headaches, weakness or numbness. Psych: No SI or HI  ____________________________________________   PHYSICAL EXAM:  VITAL SIGNS: ED Triage Vitals  Enc Vitals Group     BP 07/07/18 1754 124/86     Pulse Rate 07/07/18 1754 (!) 103     Resp 07/07/18 1754 18     Temp 07/07/18 1754 98.9 F (37.2 C)     Temp Source 07/07/18 1754 Oral     SpO2 07/07/18 1754 98 %     Weight 07/07/18 1752 200 lb (90.7 kg)     Height 07/07/18 1752 5\' 11"  (1.803 m)     Head Circumference --      Peak Flow --      Pain Score 07/07/18 1752 7     Pain Loc --      Pain Edu? --      Excl. in GC? --     Constitutional: Alert and oriented. Well appearing and in no apparent distress. HEENT:      Head: Normocephalic and atraumatic.         Eyes: Conjunctivae are normal. Sclera is non-icteric.       Mouth/Throat: Mucous membranes are dry.  Neck: Supple with no signs of meningismus. Cardiovascular: Tachycardic with regular rhythm. No murmurs, gallops, or rubs. 2+ symmetrical distal pulses are present in all extremities. No JVD. Respiratory: Normal respiratory effort. Lungs are clear to auscultation bilaterally. No wheezes, crackles, or rhonchi.  Gastrointestinal: Soft, non tender, and non distended with positive bowel sounds. No rebound or guarding. Musculoskeletal: Nontender with normal range of motion in all extremities. No edema, cyanosis, or erythema of extremities. Neurologic: Normal speech and language. Face is symmetric. Moving all extremities. No gross focal neurologic deficits are appreciated. Skin: Skin  is warm, dry and intact. No rash noted. Psychiatric: Mood and affect are normal. Speech and behavior are normal.  ____________________________________________   LABS (all labs ordered are listed, but only abnormal results are displayed)  Labs Reviewed  BASIC METABOLIC PANEL - Abnormal; Notable for the following components:      Result Value   Glucose, Bld 111 (*)    BUN 29 (*)    Creatinine, Ser 1.54 (*)    GFR calc non Af Amer 54 (*)    All other components within normal limits  CBC - Abnormal; Notable for the following components:   WBC 10.7 (*)    All other components within normal limits  URINALYSIS, COMPLETE (UACMP) WITH MICROSCOPIC - Abnormal; Notable for the following components:   Color, Urine YELLOW (*)    APPearance HAZY (*)    Ketones, ur 5 (*)    All other components within normal limits  TROPONIN I  CK   ____________________________________________  EKG  ED ECG REPORT I, Nita Sicklearolina Bryndle Corredor, the attending physician, personally viewed and interpreted this ECG.  Sinus tachycardia, rate of 104, normal intervals, normal axis, no ST elevations or depressions. Normal EKG other than tachycardia ____________________________________________  RADIOLOGY  I have personally reviewed the images performed during this visit and I agree with the Radiologist's read.   Interpretation by Radiologist:  Dg Chest 2 View  Result Date: 07/07/2018 CLINICAL DATA:  Chest pain EXAM: CHEST - 2 VIEW COMPARISON:  09/02/2011 FINDINGS: Heart and mediastinal contours are within normal limits. No focal opacities or effusions. No acute bony abnormality. IMPRESSION: No active cardiopulmonary disease. Electronically Signed   By: Charlett NoseKevin  Dover M.D.   On: 07/07/2018 18:15     ____________________________________________   PROCEDURES  Procedure(s) performed: None Procedures Critical Care performed:  None ____________________________________________   INITIAL IMPRESSION / ASSESSMENT AND  PLAN / ED COURSE   42 y.o. male no significant past medical history who presents for concerns of dehydration, dry mouth, generalized body cramping and dark urine.  Patient has been in the heat for more than 12 hours for the last 2 days.  Looks slightly dry with dry mucous membranes and mild tachycardia, no fever, otherwise patient is well-appearing with no acute findings on exam.  EKG showing no arrhythmias or ischemia.  Total CK is within normal limits.  Patient has mild leukocytosis and a creatinine of 1.54 (baseline of 1) consistent with mild dehydration.  Will give IV fluids and reassess.    _________________________ 9:10 PM on 07/07/2018 -----------------------------------------  She feels improved after IV fluids.  Will discharge home with increase p.o. hydration, follow-up with primary care doctor.   As part of my medical decision making, I reviewed the following data within the electronic MEDICAL RECORD NUMBER Nursing notes reviewed and incorporated, Labs reviewed , Old chart reviewed, Notes from prior ED visits and [place] Controlled Substance Database    Pertinent labs & imaging results that were available  during my care of the patient were reviewed by me and considered in my medical decision making (see chart for details).    ____________________________________________   FINAL CLINICAL IMPRESSION(S) / ED DIAGNOSES  Final diagnoses:  Heat exhaustion, initial encounter  Dehydration      NEW MEDICATIONS STARTED DURING THIS VISIT:  ED Discharge Orders    None       Note:  This document was prepared using Dragon voice recognition software and may include unintentional dictation errors.    Nita Sickle, MD 07/07/18 2110

## 2018-07-07 NOTE — ED Triage Notes (Signed)
Pt arrives with multiple complaints. Pt states "I have cotton mouth, chest pain, shaky hands, and dark urine." Pt very anxious in triage. Pt states he also has had severe cramps today.

## 2018-07-07 NOTE — ED Notes (Signed)
Patient transported to X-ray 

## 2020-05-21 ENCOUNTER — Emergency Department
Admission: EM | Admit: 2020-05-21 | Discharge: 2020-05-21 | Disposition: A | Discharge status: Home / Self Care | Payer: Self-pay | Attending: Emergency Medicine | Admitting: Emergency Medicine

## 2020-05-21 ENCOUNTER — Other Ambulatory Visit: Payer: Self-pay

## 2020-05-21 ENCOUNTER — Encounter: Payer: Self-pay | Admitting: Emergency Medicine

## 2020-05-21 ENCOUNTER — Emergency Department: Payer: Self-pay

## 2020-05-21 DIAGNOSIS — Y92320 Baseball field as the place of occurrence of the external cause: Secondary | ICD-10-CM | POA: Insufficient documentation

## 2020-05-21 DIAGNOSIS — W19XXXA Unspecified fall, initial encounter: Secondary | ICD-10-CM | POA: Insufficient documentation

## 2020-05-21 DIAGNOSIS — Y998 Other external cause status: Secondary | ICD-10-CM | POA: Insufficient documentation

## 2020-05-21 DIAGNOSIS — F17220 Nicotine dependence, chewing tobacco, uncomplicated: Secondary | ICD-10-CM | POA: Insufficient documentation

## 2020-05-21 DIAGNOSIS — S20211A Contusion of right front wall of thorax, initial encounter: Secondary | ICD-10-CM | POA: Insufficient documentation

## 2020-05-21 DIAGNOSIS — Y9364 Activity, baseball: Secondary | ICD-10-CM | POA: Insufficient documentation

## 2020-05-21 MED ORDER — TRAMADOL HCL 50 MG PO TABS: 50.0000 mg | ORAL_TABLET | Freq: Four times a day (QID) | ORAL | 0 refills | Status: AC | PRN

## 2020-05-21 MED ORDER — TRAMADOL HCL 50 MG PO TABS: 50.0000 mg | ORAL_TABLET | Freq: Four times a day (QID) | ORAL | 0 refills | Status: DC | PRN

## 2020-05-21 MED ORDER — LIDOCAINE 5 % EX PTCH
1.0000 | MEDICATED_PATCH | CUTANEOUS | Status: DC
  Administered 2020-05-21: 1 via TRANSDERMAL
  Filled 2020-05-21: qty 1

## 2020-05-21 MED ORDER — IBUPROFEN 600 MG PO TABS: 600.0000 mg | ORAL_TABLET | Freq: Three times a day (TID) | ORAL | 0 refills | Status: DC | PRN

## 2020-05-21 NOTE — ED Notes (Signed)
See triage note  Presents with pain to right lateral rib/chest  States he slid into base  Gail  having increased pain

## 2020-05-21 NOTE — ED Triage Notes (Signed)
Patient ambulatory to triage with steady gait, without difficulty or distress noted, mask in place; pt reports falling on rt side during softball game yesterday; c/o pain to anterior mid rt rib--tender to palpation, increases with movement and deep breathing; denies any other c/o or injury

## 2020-05-21 NOTE — ED Provider Notes (Signed)
[facility] Emergency Department Provider Note   ____________________________________________   First MD Initiated Contact with Patient 05/21/20 [phone]     (approximate)  I have reviewed the triage vital signs and the nursing notes.   HISTORY  Chief Complaint Rib Injury    HPI Scott Winters is a 44 y.o. male patient complain of right rib pain secondary to fall yesterday while playing softball.  Patient states pain increased with movement and deep inspirations.  Is also tender to palpation.  Rates pain as 8/10.  Described pain is "achy".  No palliative measure for complaint.         History reviewed. No pertinent past medical history.  There are no problems to display for this patient.   Past Surgical History:  Procedure Laterality Date  . CHOLECYSTECTOMY  2011    Prior to Admission medications   Medication Sig Start Date End Date Taking? Authorizing Provider  fexofenadine-pseudoephedrine (ALLEGRA-D) 60-120 MG 12 hr tablet Take 1 tablet by mouth 2 (two) times daily. 11/20/17   Sable Feil, PA-C  ibuprofen (ADVIL) 600 MG tablet Take 1 tablet (600 mg total) by mouth every 8 (eight) hours as needed. 05/21/20   Sable Feil, PA-C  ranitidine (ZANTAC) 150 MG tablet Take 1 tablet (150 mg total) by mouth 2 (two) times daily. 09/30/16 09/30/17  Nance Pear, MD  sucralfate (CARAFATE) 1 g tablet Take 1 tablet (1 g total) by mouth 4 (four) times daily. 09/30/16   Nance Pear, MD  traMADol (ULTRAM) 50 MG tablet Take 1 tablet (50 mg total) by mouth every 6 (six) hours as needed for up to 3 days. 05/21/20 05/24/20  Sable Feil, PA-C    Allergies Patient has no known allergies.  No family history on file.  Social History Social History   Tobacco Use  . Smoking status: Former Research scientist (life sciences)  . Smokeless tobacco: Current User    Types: Chew  Substance Use Topics  . Alcohol use: Yes    Comment: 1 beer per day  . Drug use: No    Review of  Systems Constitutional: No fever/chills Eyes: No visual changes. ENT: No sore throat. Cardiovascular: Denies chest pain. Respiratory: Denies shortness of breath. Gastrointestinal: No abdominal pain.  No nausea, no vomiting.  No diarrhea.  No constipation. Genitourinary: Negative for dysuria. Musculoskeletal: Right rib pain. Skin: Negative for rash. Neurological: Negative for headaches, focal weakness or numbness.   ____________________________________________   PHYSICAL EXAM:  VITAL SIGNS: ED Triage Vitals  Enc Vitals Group     BP 05/21/20 0640 116/74     Pulse Rate 05/21/20 0640 72     Resp 05/21/20 0640 18     Temp 05/21/20 0640 98.4 F (36.9 C)     Temp Source 05/21/20 0640 Oral     SpO2 05/21/20 0640 98 %     Weight 05/21/20 0641 225 lb (102.1 kg)     Height 05/21/20 0641 6\' 1"  (1.854 m)     Head Circumference --      Peak Flow --      Pain Score 05/21/20 0641 8     Pain Loc --      Pain Edu? --      Excl. in [place]? --    Constitutional: Alert and oriented. Well appearing and in no acute distress. Cardiovascular: Normal rate, regular rhythm. Grossly normal heart sounds.  Good peripheral circulation. Respiratory: Normal respiratory effort.  No retractions. Lungs CTAB. Gastrointestinal: Soft and nontender. No distention.  No abdominal bruits. No CVA tenderness. Musculoskeletal: No obvious deformity to the right chest wall.  Moderate guarding with palpation.   Neurologic:  Normal speech and language. No gross focal neurologic deficits are appreciated. No gait instability. Skin:  Skin is warm, dry and intact. No rash noted.  No abrasion or ecchymosis. Psychiatric: Mood and affect are normal. Speech and behavior are normal.  ____________________________________________   LABS (all labs ordered are listed, but only abnormal results are displayed)  Labs Reviewed - No data to  display ____________________________________________  EKG   ____________________________________________  RADIOLOGY  ED MD interpretation:    Official radiology report(s): DG Ribs Unilateral W/Chest Right  Result Date: 05/21/2020 CLINICAL DATA:  Injury.  Right rib pain. EXAM: RIGHT RIBS AND CHEST - 3+ VIEW COMPARISON:  Chest x-ray 07/07/2018, 09/02/2011. FINDINGS: Mediastinum and hilar structures normal. Heart size normal. Lungs are clear of acute infiltrates. No prominent pleural effusion or pneumothorax. No evidence of displaced rib fracture. IMPRESSION: No evidence of displaced rib fracture or pneumothorax. Electronically Signed   By: Maisie Fus  Register   On: 05/21/2020 07:14    ____________________________________________   PROCEDURES  Procedure(s) performed (including Critical Care):  Procedures   ____________________________________________   INITIAL IMPRESSION / ASSESSMENT AND PLAN / ED COURSE  As part of my medical decision making, I reviewed the following data within the electronic MEDICAL RECORD NUMBER     Patient presents with right upper/lateral chest wall pain secondary to fall which occurred yesterday.  Discussed no acute findings on chest/ x-ray.  Patient physical exam consistent with contusion.  Patient given discharge care instruction advised take medication as directed.    Scott Winters was evaluated in Emergency Department on 05/21/2020 for the symptoms described in the history of present illness. He was evaluated in the context of the global COVID-19 pandemic, which necessitated consideration that the patient might be at risk for infection with the SARS-CoV-2 virus that causes COVID-19. Institutional protocols and algorithms that pertain to the evaluation of patients at risk for COVID-19 are in a state of rapid change based on information released by regulatory bodies including the CDC and federal and state organizations. These policies and algorithms were followed  during the patient's care in the ED.       ____________________________________________   FINAL CLINICAL IMPRESSION(S) / ED DIAGNOSES  Final diagnoses:  Rib contusion, right, initial encounter     ED Discharge Orders         Ordered    traMADol (ULTRAM) 50 MG tablet  Every 6 hours PRN,   Status:  Discontinued     05/21/20 0732    ibuprofen (ADVIL) 600 MG tablet  Every 8 hours PRN,   Status:  Discontinued     05/21/20 0732    ibuprofen (ADVIL) 600 MG tablet  Every 8 hours PRN     05/21/20 0734    traMADol (ULTRAM) 50 MG tablet  Every 6 hours PRN     05/21/20 0734           Note:  This document was prepared using Dragon voice recognition software and may include unintentional dictation errors.    Joni Reining, PA-C 05/21/20 5364    Sharman Cheek, MD 05/21/20 1314

## 2020-05-21 NOTE — Discharge Instructions (Addendum)
Follow discharge care instruction take medication as directed. °

## 2021-04-29 ENCOUNTER — Ambulatory Visit
Admission: EM | Admit: 2021-04-29 | Discharge: 2021-04-30 | Disposition: A | Discharge status: Home / Self Care | Payer: Worker's Compensation | Attending: Emergency Medicine | Admitting: Emergency Medicine

## 2021-04-29 ENCOUNTER — Emergency Department: Payer: Worker's Compensation | Admitting: Anesthesiology

## 2021-04-29 ENCOUNTER — Encounter
Admission: EM | Disposition: A | Discharge status: Home / Self Care | Payer: Self-pay | Source: Home / Self Care | Attending: Emergency Medicine

## 2021-04-29 ENCOUNTER — Emergency Department: Payer: Worker's Compensation

## 2021-04-29 DIAGNOSIS — Z23 Encounter for immunization: Secondary | ICD-10-CM | POA: Insufficient documentation

## 2021-04-29 DIAGNOSIS — Z20822 Contact with and (suspected) exposure to covid-19: Secondary | ICD-10-CM | POA: Diagnosis not present

## 2021-04-29 DIAGNOSIS — Z87891 Personal history of nicotine dependence: Secondary | ICD-10-CM | POA: Diagnosis not present

## 2021-04-29 DIAGNOSIS — S71142A Puncture wound with foreign body, left thigh, initial encounter: Secondary | ICD-10-CM | POA: Diagnosis not present

## 2021-04-29 DIAGNOSIS — Z79899 Other long term (current) drug therapy: Secondary | ICD-10-CM | POA: Diagnosis not present

## 2021-04-29 DIAGNOSIS — Y93H3 Activity, building and construction: Secondary | ICD-10-CM | POA: Diagnosis not present

## 2021-04-29 DIAGNOSIS — S71122A Laceration with foreign body, left thigh, initial encounter: Secondary | ICD-10-CM

## 2021-04-29 DIAGNOSIS — W294XXA Contact with nail gun, initial encounter: Secondary | ICD-10-CM | POA: Diagnosis not present

## 2021-04-29 DIAGNOSIS — S80852A Superficial foreign body, left lower leg, initial encounter: Secondary | ICD-10-CM

## 2021-04-29 HISTORY — PX: FOREIGN BODY REMOVAL: SHX962

## 2021-04-29 LAB — CBC WITH DIFFERENTIAL/PLATELET
Abs Immature Granulocytes: 0.02 10*3/uL (ref 0.00–0.07)
Basophils Absolute: 0 10*3/uL (ref 0.0–0.1)
Basophils Relative: 1 %
Eosinophils Absolute: 0.2 10*3/uL (ref 0.0–0.5)
Eosinophils Relative: 2 %
HCT: 40.4 % (ref 39.0–52.0)
Hemoglobin: 14.2 g/dL (ref 13.0–17.0)
Immature Granulocytes: 0 %
Lymphocytes Relative: 19 %
Lymphs Abs: 1.2 10*3/uL (ref 0.7–4.0)
MCH: 32.1 pg (ref 26.0–34.0)
MCHC: 35.1 g/dL (ref 30.0–36.0)
MCV: 91.4 fL (ref 80.0–100.0)
Monocytes Absolute: 0.5 10*3/uL (ref 0.1–1.0)
Monocytes Relative: 8 %
Neutro Abs: 4.3 10*3/uL (ref 1.7–7.7)
Neutrophils Relative %: 70 %
Platelets: 236 10*3/uL (ref 150–400)
RBC: 4.42 MIL/uL (ref 4.22–5.81)
RDW: 12.3 % (ref 11.5–15.5)
WBC: 6.2 10*3/uL (ref 4.0–10.5)
nRBC: 0 % (ref 0.0–0.2)

## 2021-04-29 LAB — BASIC METABOLIC PANEL
Anion gap: 9 (ref 5–15)
BUN: 22 mg/dL — ABNORMAL HIGH (ref 6–20)
CO2: 23 mmol/L (ref 22–32)
Calcium: 9 mg/dL (ref 8.9–10.3)
Chloride: 106 mmol/L (ref 98–111)
Creatinine, Ser: 1.08 mg/dL (ref 0.61–1.24)
GFR, Estimated: >60 mL/min (ref >60)
Glucose, Bld: 103 mg/dL — ABNORMAL HIGH (ref 70–99)
Potassium: 3.8 mmol/L (ref 3.5–5.1)
Sodium: 138 mmol/L (ref 135–145)

## 2021-04-29 LAB — RESP PANEL BY RT-PCR (FLU A&B, COVID) ARPGX2
Influenza A by PCR: NEGATIVE
Influenza B by PCR: NEGATIVE
SARS Coronavirus 2 by RT PCR: NEGATIVE

## 2021-04-29 LAB — TYPE AND SCREEN
ABO/RH(D): O POS
Antibody Screen: NEGATIVE

## 2021-04-29 SURGERY — FOREIGN BODY REMOVAL ADULT
Anesthesia: Choice | Site: Thigh | Laterality: Left

## 2021-04-29 MED ORDER — ROCURONIUM BROMIDE 100 MG/10ML IV SOLN
INTRAVENOUS | Status: DC | PRN
  Administered 2021-04-29: 30 mg via INTRAVENOUS
  Administered 2021-04-29: 20 mg via INTRAVENOUS

## 2021-04-29 MED ORDER — ONDANSETRON HCL 4 MG/2ML IJ SOLN
INTRAMUSCULAR | Status: AC
  Filled 2021-04-29: qty 2

## 2021-04-29 MED ORDER — HYDROCODONE-ACETAMINOPHEN 5-325 MG PO TABS
1.0000 | ORAL_TABLET | ORAL | Status: DC | PRN
  Administered 2021-04-29: 1 via ORAL

## 2021-04-29 MED ORDER — PROPOFOL 10 MG/ML IV BOLUS
INTRAVENOUS | Status: AC
  Filled 2021-04-29: qty 20

## 2021-04-29 MED ORDER — MORPHINE SULFATE (PF) 2 MG/ML IV SOLN: 0.5000 mg | INTRAVENOUS | Status: DC | PRN

## 2021-04-29 MED ORDER — TETANUS-DIPHTH-ACELL PERTUSSIS 5-2.5-18.5 LF-MCG/0.5 IM SUSY
0.5000 mL | PREFILLED_SYRINGE | Freq: Once | INTRAMUSCULAR | Status: AC
Start: 2021-04-29 — End: 2021-04-29
  Administered 2021-04-29: 0.5 mL via INTRAMUSCULAR
  Filled 2021-04-29: qty 0.5

## 2021-04-29 MED ORDER — ONDANSETRON HCL 4 MG/2ML IJ SOLN: 4.0000 mg | Freq: Four times a day (QID) | INTRAMUSCULAR | Status: DC | PRN

## 2021-04-29 MED ORDER — FENTANYL CITRATE (PF) 100 MCG/2ML IJ SOLN
INTRAMUSCULAR | Status: AC
  Filled 2021-04-29: qty 2

## 2021-04-29 MED ORDER — HYDROCODONE-ACETAMINOPHEN 5-325 MG PO TABS: 1.0000 | ORAL_TABLET | ORAL | 0 refills | Status: AC | PRN

## 2021-04-29 MED ORDER — ONDANSETRON HCL 4 MG/2ML IJ SOLN
INTRAMUSCULAR | Status: DC | PRN
  Administered 2021-04-29: 4 mg via INTRAVENOUS

## 2021-04-29 MED ORDER — ACETAMINOPHEN 10 MG/ML IV SOLN
INTRAVENOUS | Status: AC
  Filled 2021-04-29: qty 100

## 2021-04-29 MED ORDER — MORPHINE SULFATE (PF) 4 MG/ML IV SOLN
4.0000 mg | Freq: Once | INTRAVENOUS | Status: AC
  Administered 2021-04-29: 4 mg via INTRAVENOUS
  Filled 2021-04-29: qty 1

## 2021-04-29 MED ORDER — SUCCINYLCHOLINE CHLORIDE 20 MG/ML IJ SOLN
INTRAMUSCULAR | Status: DC | PRN
  Administered 2021-04-29: 130 mg via INTRAVENOUS

## 2021-04-29 MED ORDER — DEXMEDETOMIDINE (PRECEDEX) IN NS 20 MCG/5ML (4 MCG/ML) IV SYRINGE
PREFILLED_SYRINGE | INTRAVENOUS | Status: DC | PRN
  Administered 2021-04-29: 8 ug via INTRAVENOUS
  Administered 2021-04-29: 12 ug via INTRAVENOUS

## 2021-04-29 MED ORDER — FENTANYL CITRATE (PF) 100 MCG/2ML IJ SOLN
INTRAMUSCULAR | Status: DC | PRN
  Administered 2021-04-29 (×2): 50 ug via INTRAVENOUS

## 2021-04-29 MED ORDER — ACETAMINOPHEN 325 MG PO TABS: 325.0000 mg | ORAL_TABLET | Freq: Four times a day (QID) | ORAL | Status: DC | PRN

## 2021-04-29 MED ORDER — MIDAZOLAM HCL 2 MG/2ML IJ SOLN
INTRAMUSCULAR | Status: AC
  Filled 2021-04-29: qty 2

## 2021-04-29 MED ORDER — DOCUSATE SODIUM 100 MG PO CAPS: 100.0000 mg | ORAL_CAPSULE | Freq: Every day | ORAL | 2 refills | Status: AC | PRN

## 2021-04-29 MED ORDER — KETOROLAC TROMETHAMINE 15 MG/ML IJ SOLN: 15.0000 mg | Freq: Four times a day (QID) | INTRAMUSCULAR | Status: DC

## 2021-04-29 MED ORDER — MIDAZOLAM HCL 2 MG/2ML IJ SOLN
INTRAMUSCULAR | Status: DC | PRN
  Administered 2021-04-29: 2 mg via INTRAVENOUS

## 2021-04-29 MED ORDER — HYDROCODONE-ACETAMINOPHEN 7.5-325 MG PO TABS
1.0000 | ORAL_TABLET | ORAL | Status: DC | PRN
Start: 2021-04-29 — End: 2021-04-30

## 2021-04-29 MED ORDER — FENTANYL CITRATE (PF) 100 MCG/2ML IJ SOLN
25.0000 ug | INTRAMUSCULAR | Status: DC | PRN
Start: 2021-04-29 — End: 2021-04-30

## 2021-04-29 MED ORDER — DEXAMETHASONE SODIUM PHOSPHATE 10 MG/ML IJ SOLN
INTRAMUSCULAR | Status: AC
  Filled 2021-04-29: qty 1

## 2021-04-29 MED ORDER — ONDANSETRON HCL 4 MG/2ML IJ SOLN: 4.0000 mg | Freq: Once | INTRAMUSCULAR | Status: DC | PRN

## 2021-04-29 MED ORDER — BUPIVACAINE-EPINEPHRINE (PF) 0.25% -1:200000 IJ SOLN
INTRAMUSCULAR | Status: AC
  Filled 2021-04-29: qty 30

## 2021-04-29 MED ORDER — PROPOFOL 10 MG/ML IV BOLUS
INTRAVENOUS | Status: DC | PRN
  Administered 2021-04-29: 190 mg via INTRAVENOUS

## 2021-04-29 MED ORDER — KETOROLAC TROMETHAMINE 15 MG/ML IJ SOLN
INTRAMUSCULAR | Status: AC
  Administered 2021-04-29: 15 mg via INTRAVENOUS
  Filled 2021-04-29: qty 1

## 2021-04-29 MED ORDER — SUGAMMADEX SODIUM 200 MG/2ML IV SOLN
INTRAVENOUS | Status: DC | PRN
  Administered 2021-04-29: 200 mg via INTRAVENOUS

## 2021-04-29 MED ORDER — ROCURONIUM BROMIDE 10 MG/ML (PF) SYRINGE
PREFILLED_SYRINGE | INTRAVENOUS | Status: AC
  Filled 2021-04-29: qty 10

## 2021-04-29 MED ORDER — LIDOCAINE HCL (CARDIAC) PF 100 MG/5ML IV SOSY
PREFILLED_SYRINGE | INTRAVENOUS | Status: DC | PRN
  Administered 2021-04-29: 80 mg via INTRAVENOUS

## 2021-04-29 MED ORDER — CEPHALEXIN 500 MG PO CAPS: 500.0000 mg | ORAL_CAPSULE | Freq: Four times a day (QID) | ORAL | 0 refills | Status: AC

## 2021-04-29 MED ORDER — METOCLOPRAMIDE HCL 10 MG PO TABS: 5.0000 mg | ORAL_TABLET | Freq: Three times a day (TID) | ORAL | Status: DC | PRN

## 2021-04-29 MED ORDER — ONDANSETRON HCL 4 MG PO TABS: 4.0000 mg | ORAL_TABLET | Freq: Four times a day (QID) | ORAL | Status: DC | PRN

## 2021-04-29 MED ORDER — NEOMYCIN-POLYMYXIN B GU 40-200000 IR SOLN
Status: DC | PRN
  Administered 2021-04-29: 4 mL

## 2021-04-29 MED ORDER — DEXAMETHASONE SODIUM PHOSPHATE 10 MG/ML IJ SOLN
INTRAMUSCULAR | Status: DC | PRN
  Administered 2021-04-29: 10 mg via INTRAVENOUS

## 2021-04-29 MED ORDER — HYDROCODONE-ACETAMINOPHEN 5-325 MG PO TABS
ORAL_TABLET | ORAL | Status: AC
  Filled 2021-04-29: qty 1

## 2021-04-29 MED ORDER — DEXMEDETOMIDINE (PRECEDEX) IN NS 20 MCG/5ML (4 MCG/ML) IV SYRINGE
PREFILLED_SYRINGE | INTRAVENOUS | Status: AC
  Filled 2021-04-29: qty 5

## 2021-04-29 MED ORDER — LACTATED RINGERS IV SOLN: INTRAVENOUS | Status: DC | PRN

## 2021-04-29 MED ORDER — SUCCINYLCHOLINE CHLORIDE 200 MG/10ML IV SOSY
PREFILLED_SYRINGE | INTRAVENOUS | Status: AC
  Filled 2021-04-29: qty 10

## 2021-04-29 MED ORDER — METOCLOPRAMIDE HCL 5 MG/ML IJ SOLN: 5.0000 mg | Freq: Three times a day (TID) | INTRAMUSCULAR | Status: DC | PRN

## 2021-04-29 MED ORDER — CEFAZOLIN SODIUM-DEXTROSE 2-3 GM-%(50ML) IV SOLR
INTRAVENOUS | Status: DC | PRN
  Administered 2021-04-29: 2 g via INTRAVENOUS

## 2021-04-29 MED ORDER — BUPIVACAINE-EPINEPHRINE (PF) 0.25% -1:200000 IJ SOLN
INTRAMUSCULAR | Status: DC | PRN
  Administered 2021-04-29: 20 mL

## 2021-04-29 MED ORDER — FENTANYL CITRATE (PF) 100 MCG/2ML IJ SOLN: 25.0000 ug | INTRAMUSCULAR | Status: DC | PRN

## 2021-04-29 MED ORDER — ACETAMINOPHEN 10 MG/ML IV SOLN
INTRAVENOUS | Status: DC | PRN
  Administered 2021-04-29: 1000 mg via INTRAVENOUS

## 2021-04-29 SURGICAL SUPPLY — 43 items
BNDG COHESIVE 4X5 TAN STRL (GAUZE/BANDAGES/DRESSINGS) ×2 IMPLANT
BNDG ELASTIC 6X5.8 VLCR STR LF (GAUZE/BANDAGES/DRESSINGS) ×1 IMPLANT
BNDG GAUZE 4.5X4.1 6PLY STRL (MISCELLANEOUS) ×2 IMPLANT
BRUSH SCRUB EZ  4% CHG (MISCELLANEOUS)
BRUSH SCRUB EZ 1% IODOPHOR (MISCELLANEOUS) ×2 IMPLANT
BRUSH SCRUB EZ 4% CHG (MISCELLANEOUS) ×2 IMPLANT
CANISTER SUCT 1200ML W/VALVE (MISCELLANEOUS) ×1 IMPLANT
CHLORAPREP W/TINT 26 (MISCELLANEOUS) ×3 IMPLANT
COVER WAND RF STERILE (DRAPES) ×1 IMPLANT
DRAPE 3/4 80X56 (DRAPES) ×1 IMPLANT
DRAPE INCISE IOBAN 66X60 STRL (DRAPES) ×1 IMPLANT
DRAPE SURG 17X11 SM STRL (DRAPES) IMPLANT
DRAPE U-SHAPE 47X51 STRL (DRAPES) ×1 IMPLANT
DRSG PAD ABDOMINAL 8X10 ST (GAUZE/BANDAGES/DRESSINGS) ×2 IMPLANT
ELECT REM PT RETURN 9FT ADLT (ELECTROSURGICAL) ×2
ELECTRODE REM PT RTRN 9FT ADLT (ELECTROSURGICAL) ×1 IMPLANT
GAUZE SPONGE 4X4 12PLY STRL (GAUZE/BANDAGES/DRESSINGS) ×1 IMPLANT
GAUZE XEROFORM 1X8 LF (GAUZE/BANDAGES/DRESSINGS) ×1 IMPLANT
GLOVE SURG ORTHO LTX SZ8 (GLOVE) ×2 IMPLANT
GLOVE SURG UNDER LTX SZ8 (GLOVE) ×2 IMPLANT
GOWN STRL REUS W/ TWL LRG LVL3 (GOWN DISPOSABLE) ×1 IMPLANT
GOWN STRL REUS W/ TWL XL LVL3 (GOWN DISPOSABLE) ×1 IMPLANT
GOWN STRL REUS W/TWL LRG LVL3 (GOWN DISPOSABLE) ×1
GOWN STRL REUS W/TWL XL LVL3 (GOWN DISPOSABLE) ×1
HANDLE YANKAUER SUCT BULB TIP (MISCELLANEOUS) ×1 IMPLANT
IMMBOLIZER KNEE 19 BLUE UNIV (SOFTGOODS) ×1 IMPLANT
KIT TURNOVER KIT A (KITS) ×2 IMPLANT
MANIFOLD NEPTUNE II (INSTRUMENTS) ×2 IMPLANT
NS IRRIG 1000ML POUR BTL (IV SOLUTION) ×2 IMPLANT
PACK EXTREMITY ARMC (MISCELLANEOUS) ×2 IMPLANT
SPONGE LAP 18X18 RF (DISPOSABLE) ×1 IMPLANT
STAPLER SKIN PROX 35W (STAPLE) ×2 IMPLANT
STOCKINETTE IMPERVIOUS 9X36 MD (GAUZE/BANDAGES/DRESSINGS) ×2 IMPLANT
SUT ETHILON 2 0 FSLX (SUTURE) IMPLANT
SUT ETHILON NAB PS2 4-0 18IN (SUTURE) IMPLANT
SUT PDS AB 2-0 CT1 27 (SUTURE) IMPLANT
SUT VIC AB 0 CT1 36 (SUTURE) ×1 IMPLANT
SUT VIC AB 1 CT1 36 (SUTURE) IMPLANT
SUT VIC AB 2-0 CT1 (SUTURE) ×1 IMPLANT
SUT VIC AB 2-0 CT1 27 (SUTURE) ×1
SUT VIC AB 2-0 CT1 TAPERPNT 27 (SUTURE) IMPLANT
SUT VICRYL AB 3-0 FS1 BRD 27IN (SUTURE) IMPLANT
TOWEL OR 17X26 4PK STRL BLUE (TOWEL DISPOSABLE) ×2 IMPLANT

## 2021-04-29 NOTE — Anesthesia Procedure Notes (Signed)
Procedure Name: Intubation Date/Time: 04/29/2021 9:37 PM Performed by: Irving Burton, CRNA Pre-anesthesia Checklist: Patient identified, Emergency Drugs available, Suction available and Patient being monitored Patient Re-evaluated:Patient Re-evaluated prior to induction Oxygen Delivery Method: Circle system utilized Preoxygenation: Pre-oxygenation with 100% oxygen Induction Type: IV induction Ventilation: Mask ventilation without difficulty Laryngoscope Size: McGraph and 4 Grade View: Grade I Tube type: Oral Tube size: 7.5 mm Number of attempts: 1 Airway Equipment and Method: Stylet and Video-laryngoscopy Placement Confirmation: ETT inserted through vocal cords under direct vision,  positive ETCO2 and breath sounds checked- equal and bilateral Secured at: 22 cm Tube secured with: Tape Dental Injury: Teeth and Oropharynx as per pre-operative assessment

## 2021-04-29 NOTE — Transfer of Care (Signed)
Immediate Anesthesia Transfer of Care Note  Patient: Scott Winters  Procedure(s) Performed: FOREIGN BODY REMOVAL ADULT (Left Thigh)  Patient Location: PACU  Anesthesia Type:General  Level of Consciousness: sedated  Airway & Oxygen Therapy: Patient Spontanous Breathing and Patient connected to face mask oxygen  Post-op Assessment: Post -op Vital signs reviewed and stable  Post vital signs: stable  Last Vitals:  Vitals Value Taken Time  BP 104/69 04/29/21 2244  Temp 97.49f   Pulse 87 04/29/21 2246  Resp 14 04/29/21 2246  SpO2 96 % 04/29/21 2246  Vitals shown include unvalidated device data.  Last Pain:  Vitals:   04/29/21 1941  TempSrc:   PainSc: 7          Complications: No complications documented.

## 2021-04-29 NOTE — OR Nursing (Signed)
Pt desires, cut clothing to be discarded, Irving Burton and Phineas Semen served as witnesses, grey band and silicone band placed in bag with label. Pt tobacco also secured in bag.

## 2021-04-29 NOTE — ED Triage Notes (Signed)
Pt arrives via EMS. Was holding a nail gun, bumped it on a wall firing the nail gun. 3 inch nail fired, entered through mid left thigh, angled downward. No exit wound noted. Neurovasc intact.

## 2021-04-29 NOTE — Discharge Instructions (Signed)
AMBULATORY SURGERY  DISCHARGE INSTRUCTIONS   1) The drugs that you were given will stay in your system until tomorrow so for the next 24 hours you should not:  A) Drive an automobile B) Make any legal decisions C) Drink any alcoholic beverage   2) You may resume regular meals tomorrow.  Today it is better to start with liquids and gradually work up to solid foods.  You may eat anything you prefer, but it is better to start with liquids, then soup and crackers, and gradually work up to solid foods.   3) Please notify your doctor immediately if you have any unusual bleeding, trouble breathing, redness and pain at the surgery site, drainage, fever, or pain not relieved by medication.    4) Additional Instructions:        Please contact your physician with any problems or Same Day Surgery at [phone], Monday through Friday 6 am to 4 pm, or [place] [facility] number at [phone].May weight bear as tolerated, but limit knee flexion to less than 90 degrees until he comes in to the office  May remove dressing and place band-aid in 3 days OK to shower once the dressing is removed  Start antibiotics ASAP and take for 3 days  Call [phone] and make an appointment with Altamese Cabal PA-C in 10 to 12 days for staple removal Patient is to be out of work until he follows up in the office  Please call with questions including fever, shortness of breath or drainage from the wound [phone]

## 2021-04-29 NOTE — OR Nursing (Signed)
MD request nail placed in sterile cup for patient. MD aware of policy.

## 2021-04-29 NOTE — ED Notes (Signed)
OR here for patient, vitals updated, neurovasc remains intact.

## 2021-04-29 NOTE — ED Provider Notes (Signed)
[facility] Emergency Department Provider Note  ____________________________________________   Event Date/Time   First MD Initiated Contact with Patient 04/29/21 1905     (approximate)  I have reviewed the triage vital signs and the nursing notes.   HISTORY  Chief Complaint Leg Pain   HPI Scott Winters is a 45 y.o. male who presents to the emergency department via EMS for evaluation of foreign body to the leg.  Patient states they were framing a house, he went to pick up the nail gun when it accidentally misfired, projecting a 3 inch nail into the left distal quadriceps area.  He is complaining about a significant amount of pain, received 200 mics of fentanyl in route to the ER.  He has no significant past medical history, is not on any blood thinners.       No past medical history on file.  There are no problems to display for this patient.   Past Surgical History:  Procedure Laterality Date  . CHOLECYSTECTOMY  2011    Prior to Admission medications   Medication Sig Start Date End Date Taking? Authorizing Provider  fexofenadine-pseudoephedrine (ALLEGRA-D) 60-120 MG 12 hr tablet Take 1 tablet by mouth 2 (two) times daily. 11/20/17   Joni Reining, PA-C  ibuprofen (ADVIL) 600 MG tablet Take 1 tablet (600 mg total) by mouth every 8 (eight) hours as needed. 05/21/20   Joni Reining, PA-C  ranitidine (ZANTAC) 150 MG tablet Take 1 tablet (150 mg total) by mouth 2 (two) times daily. 09/30/16 09/30/17  Phineas Semen, MD  sucralfate (CARAFATE) 1 g tablet Take 1 tablet (1 g total) by mouth 4 (four) times daily. 09/30/16   Phineas Semen, MD    Allergies Patient has no known allergies.  No family history on file.  Social History Social History   Tobacco Use  . Smoking status: Former Games developer  . Smokeless tobacco: Current User    Types: Chew  Vaping Use  . Vaping Use: Never used  Substance Use Topics  . Alcohol use: Yes    Comment: 1 beer  per day  . Drug use: No    Review of Systems Constitutional: No fever/chills Eyes: No visual changes. ENT: No sore throat. Cardiovascular: Denies chest pain. Respiratory: Denies shortness of breath. Gastrointestinal: No abdominal pain.  No nausea, no vomiting.  No diarrhea.  No constipation. Genitourinary: Negative for dysuria. Musculoskeletal: + Left leg pain, negative for back pain. Skin: Negative for rash. Neurological: Negative for headaches, focal weakness or numbness.   ____________________________________________   PHYSICAL EXAM:  VITAL SIGNS: ED Triage Vitals  Enc Vitals Group     BP 04/29/21 1903 130/79     Pulse Rate 04/29/21 1903 76     Resp 04/29/21 1903 18     Temp 04/29/21 1903 98.1 F (36.7 C)     Temp Source 04/29/21 1903 Oral     SpO2 04/29/21 1903 98 %     Weight 04/29/21 1904 230 lb (104.3 kg)     Height 04/29/21 1904 5\' 11"  (1.803 m)     Head Circumference --      Peak Flow --      Pain Score 04/29/21 1904 10     Pain Loc --      Pain Edu? --      Excl. in [place]? --    Constitutional: Alert and oriented. Well appearing and in no acute distress. Eyes: Conjunctivae are normal. PERRL. EOMI. Head: Atraumatic. Nose: No congestion/rhinnorhea.  Mouth/Throat: Mucous membranes are moist.  Oropharynx non-erythematous. Neck: No stridor.   Cardiovascular: Normal rate, regular rhythm. Grossly normal heart sounds.  Good peripheral circulation. Respiratory: Normal respiratory effort.  No retractions. Lungs CTAB. Musculoskeletal: There is an obvious entry wound in the distal quadriceps, approximately 2 inches above the patella.  The reported foreign body is unable to be palpated, no active bleeding from the site.  No exit wound noted.  Range of motion of the knee not assessed secondary to patient's pain.  2+ dorsal pedal and posterior tibial pulses, capillary refill less than 3 seconds. Neurologic:  Normal speech and language. No gross focal neurologic deficits are  appreciated.  Skin:  Skin is warm, dry and intact except as described above. No rash noted. Psychiatric: Mood and affect are normal. Speech and behavior are normal.  ____________________________________________   LABS (all labs ordered are listed, but only abnormal results are displayed)  Labs Reviewed  BASIC METABOLIC PANEL - Abnormal; Notable for the following components:      Result Value   Glucose, Bld 103 (*)    BUN 22 (*)    All other components within normal limits  RESP PANEL BY RT-PCR (FLU A&B, COVID) ARPGX2  CBC WITH DIFFERENTIAL/PLATELET  TYPE AND SCREEN    ____________________________________________  RADIOLOGY Susa Raring, personally viewed and evaluated these images (plain radiographs) as part of my medical decision making, as well as reviewing the written report by the radiologist.  ED Provider interpretation: X-ray demonstrates a relatively deep nail in the anteromedial soft tissue of the quad, no obvious involvement of the knee joint  ____________________________________________   INITIAL IMPRESSION / ASSESSMENT AND PLAN / ED COURSE  As part of my medical decision making, I reviewed the following data within the electronic MEDICAL RECORD NUMBER Nursing notes reviewed and incorporated, Labs reviewed, Radiograph reviewed, A consult was requested and obtained from this/these consultant(s) Orthopedics, Evaluated by EM attending Dr. Larinda Buttery and Notes from prior ED visits        Patient is a 45 year old male who presents to the emergency department for evaluation of foreign body in the distal quadricep musculature sustained when a nail gun misfired.  See HPI for further details.  In triage, patient is mildly hypertensive but otherwise has normal vital signs.  On physical exam, there is an obvious entry wound 2 inches proximal to the superior patella with no clear exit wound, however the foreign body is unable to be clearly palpated.  There is no active bleeding  from the wound.  X-rays were obtained and demonstrate a relatively deep foreign body in the soft tissue.  Dr. Odis Luster, on-call orthopedics was consulted on the case.  He recommended that the patient be taken to the OR for removal of this foreign body, and patient is amenable with this plan.  Patient is stable at this time for transfer to the operating room.      ____________________________________________   FINAL CLINICAL IMPRESSION(S) / ED DIAGNOSES  Final diagnoses:  Foreign body of leg, left, initial encounter     ED Discharge Orders    None      *Please note:  Jeray Shugart was evaluated in Emergency Department on 04/29/2021 for the symptoms described in the history of present illness. He was evaluated in the context of the global COVID-19 pandemic, which necessitated consideration that the patient might be at risk for infection with the SARS-CoV-2 virus that causes COVID-19. Institutional protocols and algorithms that pertain to the evaluation of patients  at risk for COVID-19 are in a state of rapid change based on information released by regulatory bodies including the CDC and federal and state organizations. These policies and algorithms were followed during the patient's care in the ED.  Some ED evaluations and interventions may be delayed as a result of limited staffing during and the pandemic.*   Note:  This document was prepared using Dragon voice recognition software and may include unintentional dictation errors.   Lucy Chris, PA 04/29/21 2004    Chesley Noon, MD 04/30/21 [phone]

## 2021-04-30 ENCOUNTER — Encounter: Payer: Self-pay | Admitting: Orthopedic Surgery

## 2021-05-03 NOTE — Anesthesia Postprocedure Evaluation (Signed)
Anesthesia Post Note  Patient: Scott Winters  Procedure(s) Performed: FOREIGN BODY REMOVAL ADULT (Left Thigh)  Patient location during evaluation: PACU Anesthesia Type: General Level of consciousness: awake and alert Pain management: pain level controlled Vital Signs Assessment: post-procedure vital signs reviewed and stable Respiratory status: spontaneous breathing, nonlabored ventilation, respiratory function stable and patient connected to nasal cannula oxygen Cardiovascular status: blood pressure returned to baseline and stable Postop Assessment: no apparent nausea or vomiting Anesthetic complications: no   No complications documented.   Last Vitals:  Vitals:   04/30/21 0000 04/30/21 0015  BP: 125/68 123/83  Pulse: 70 72  Resp: 17 16  Temp:    SpO2: 98% 98%    Last Pain:  Vitals:   04/30/21 0015  TempSrc:   PainSc: 3                  Yevette Edwards

## 2021-05-03 NOTE — Anesthesia Preprocedure Evaluation (Signed)
Anesthesia Evaluation  Patient identified by MRN, date of birth, ID band Patient awake    Reviewed: Allergy & Precautions, H&P , NPO status , Patient's Chart, lab work & pertinent test results, reviewed documented beta blocker date and time   Airway Mallampati: II   Neck ROM: full    Dental  (+) Poor Dentition, Teeth Intact   Pulmonary neg pulmonary ROS, former smoker,    Pulmonary exam normal        Cardiovascular negative cardio ROS Normal cardiovascular exam Rhythm:regular Rate:Normal     Neuro/Psych negative neurological ROS  negative psych ROS   GI/Hepatic negative GI ROS, Neg liver ROS,   Endo/Other  negative endocrine ROS  Renal/GU negative Renal ROS  negative genitourinary   Musculoskeletal   Abdominal   Peds  Hematology negative hematology ROS (+)   Anesthesia Other Findings No past medical history on file. Past Surgical History: 2011: CHOLECYSTECTOMY 04/29/2021: FOREIGN BODY REMOVAL; Left     Comment:  Procedure: FOREIGN BODY REMOVAL ADULT;  Surgeon: Lyndle Herrlich, MD;  Location: [facility] ORS;  Service: Orthopedics;               Laterality: Left; BMI    Body Mass Index: 32.08 kg/m     Reproductive/Obstetrics negative OB ROS                             Anesthesia Physical Anesthesia Plan  ASA: I and emergent  Anesthesia Plan: General   Post-op Pain Management:    Induction:   PONV Risk Score and Plan:   Airway Management Planned:   Additional Equipment:   Intra-op Plan:   Post-operative Plan:   Informed Consent: I have reviewed the patients History and Physical, chart, labs and discussed the procedure including the risks, benefits and alternatives for the proposed anesthesia with the patient or authorized representative who has indicated his/her understanding and acceptance.     Dental Advisory Given  Plan Discussed with: CRNA  Anesthesia  Plan Comments:         Anesthesia Quick Evaluation

## 2021-05-05 NOTE — Op Note (Signed)
  04/29/2021  12:44 PM  PATIENT:  Scott Winters    PRE-OPERATIVE DIAGNOSIS: Nail gun injury to the left thigh  POST-OPERATIVE DIAGNOSIS:  Same  PROCEDURE:  FOREIGN BODY REMOVAL, DEEP LEFT THIGH, IRRIGATION AND DEBRIDEMENT OF SKIN, SUBCUTANEOUS TISSUE AND MUSCLE LEFT THIGH  SURGEON:  Lyndle Herrlich, MD  ANESTHESIA:   General  PREOPERATIVE INDICATIONS:  Scott Winters is a  45 y.o. male with a diagnosis of nail gun injury who  elected for surgical management of the INJURY.  The risks benefits and alternatives were discussed with the patient preoperatively including but not limited to the risks of infection, bleeding, nerve injury, cardiopulmonary complications, the need for revision surgery, among others, and the patient was willing to proceed.  EBL: 10 CC  TOURNIQUET TIME: none used  OPERATIVE IMPLANTS: none  OPERATIVE FINDINGS: Entry wound anterior mid thigh, without exit wound. Deep exploration revealed penetration of the quadriceps tendon and vastus medialis muscle  OPERATIVE PROCEDURE:   After informed consent was obtained and the appropriate extremity marked in the pre-operative holding area, the patient was taken to the operating room and placed in the supine position. General anesthesia was induced, preoperative antibiotics were given and the left lower extremity was prepped and draped in standard sterile fashion. The skin was debrided sharply with a scalpel and the wound extended with an incision proximally of 4 cm. The deep fascia was divided and the nail identified within the vastus medialis. Using electrocautery the muscle and fascia were divided and the nail removed. Care was taken to debride the necrotic subcutaneous tissue and muscle with scissors. The wound was irrigated with copious amounts of antibiotic laden normal saline. 0 Vicryl was used for the deep layer, and 2-0 Vicryl for the subcutaneous layer. Staples were used for skin closure. A sterile dressing was  applied. He tolerated the procedure well and was taken to the recovery room in good condition.   Scott Winters. Scott Luster, MD

## 2021-05-21 NOTE — H&P (Signed)
The patient has been re-examined, and the chart reviewed, and there have been no interval changes to the documented history and physical.  Plan a left thigh I&D with removal of foreign body, deep today.  Anesthesia is not consulted regarding a peripheral nerve block for post-operative pain.  The risks, benefits, and alternatives have been discussed at length, and the patient is willing to proceed.

## 2022-08-27 IMAGING — DX DG FEMUR 2+V*L*
4 series · 4 of 4 positions shown · non-contrast
Comparison: None.

CLINICAL DATA: Left upper leg foreign body.  Nail gun injury.

EXAM:
LEFT FEMUR 2 VIEWS

[femur ap (1 of 2)]
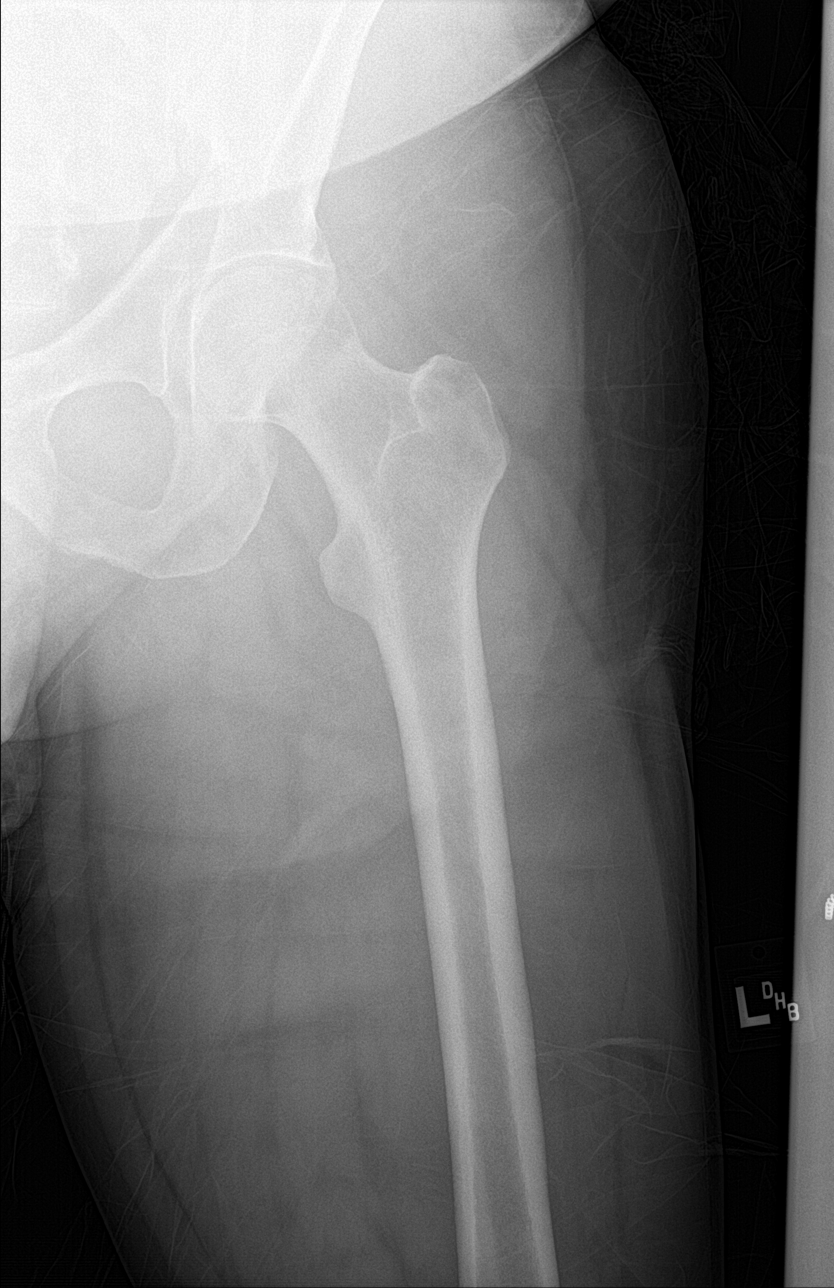

[femur ap (2 of 2)]
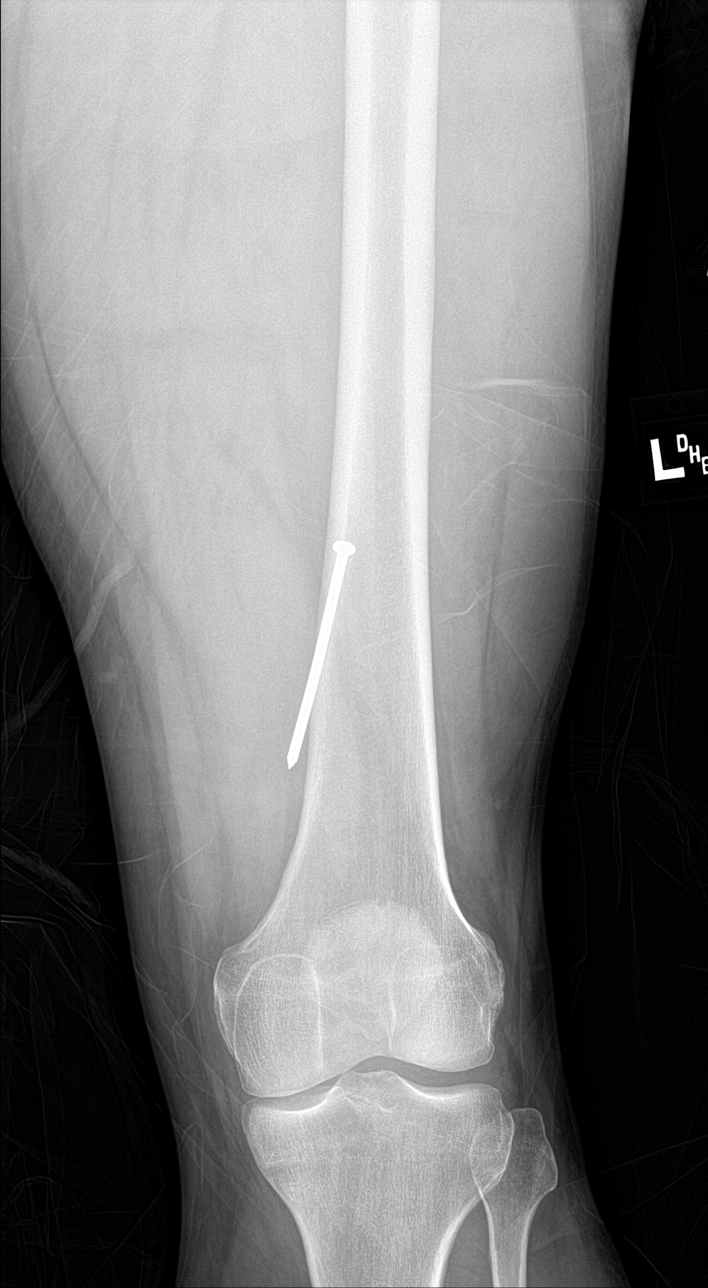

[femur lat (1 of 2)]
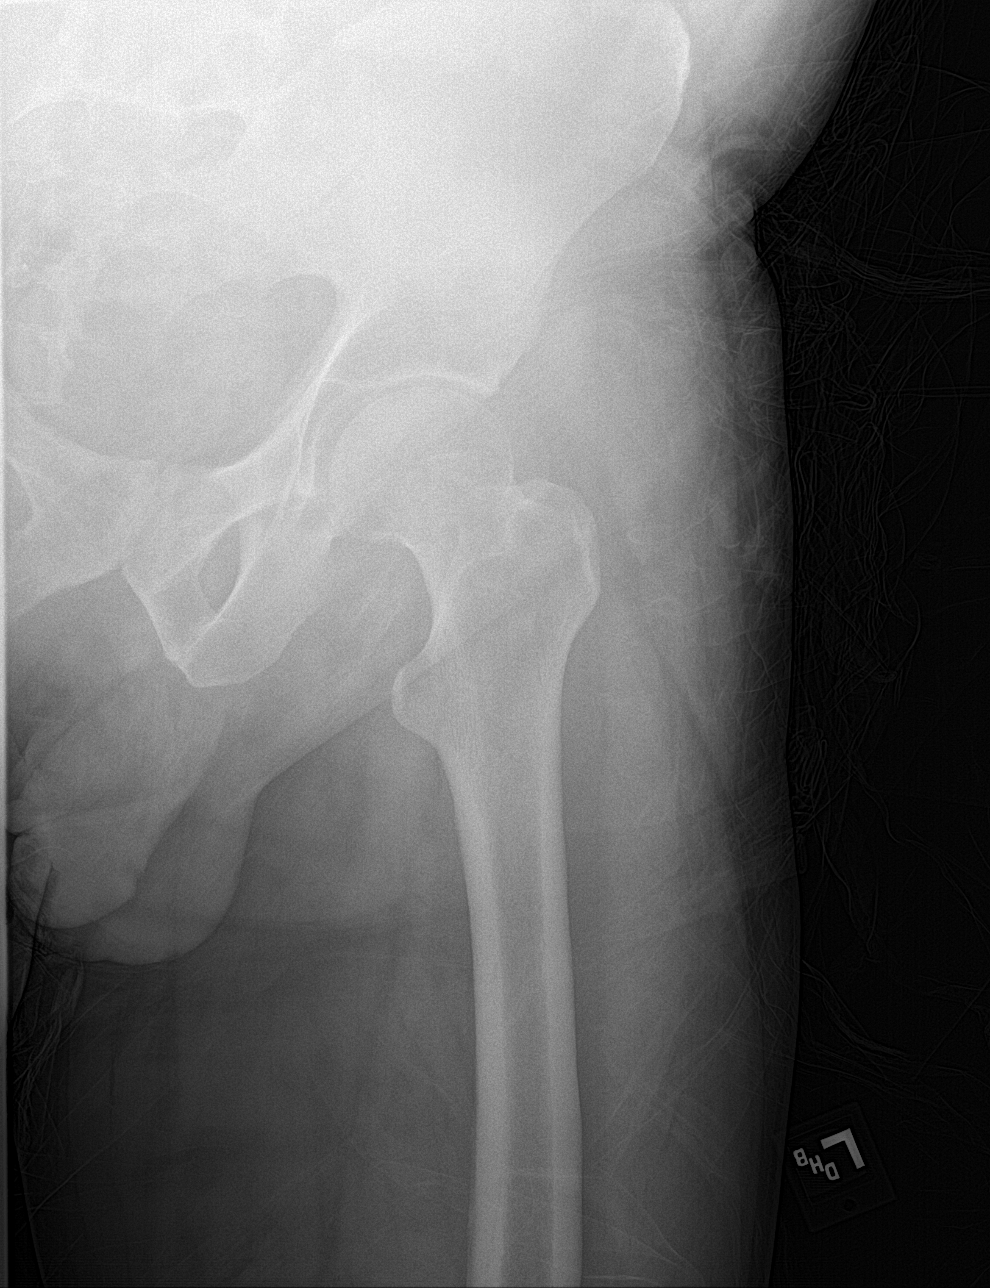

[femur lat (2 of 2)]
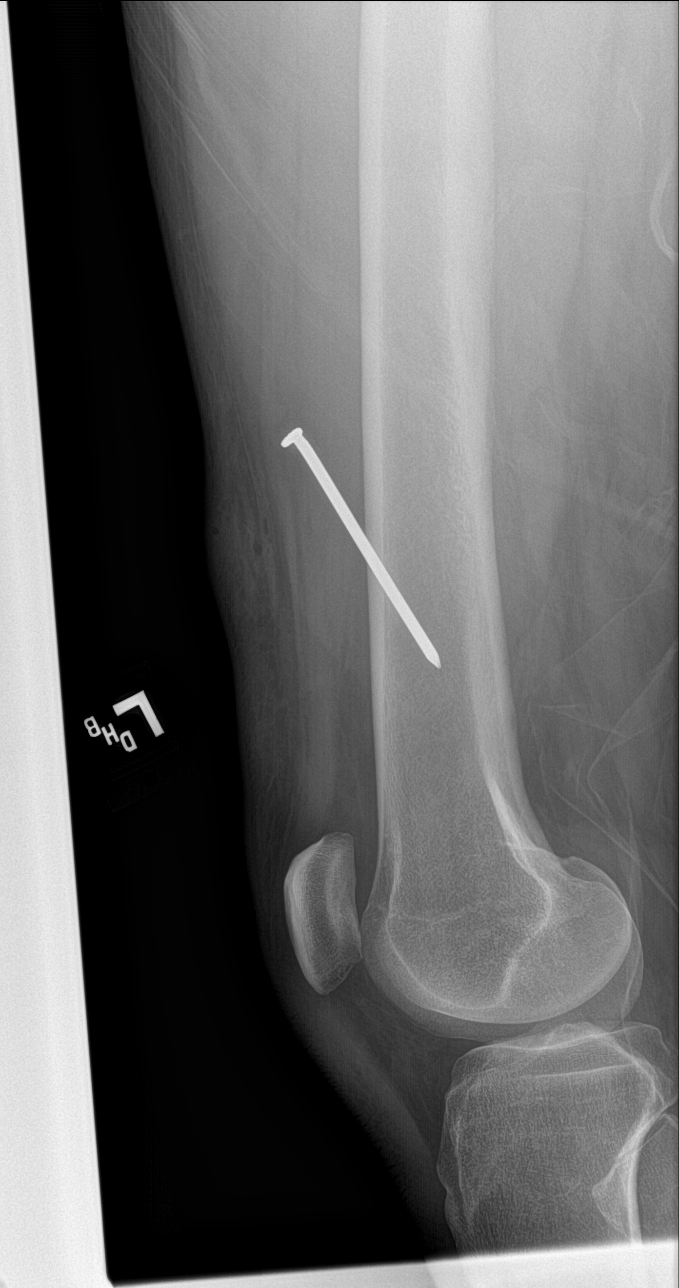

[4 of 4 positions shown; findings below may reference images not displayed]

FINDINGS: 8 cm nail projects in the soft tissues of the anteromedial aspect of
the distal thigh. The nail does not appear to bridge the femoral
cortex. No evidence of femur fracture. Hip and knee alignment are
maintained. Soft tissue edema is noted at the entry site anteriorly.
IMPRESSION: A 8 cm nail projects in the soft tissues of the anteromedial distal
thigh. The nail does not appear to bridge the femoral cortex. No
femur fracture.

## 2022-11-17 ENCOUNTER — Other Ambulatory Visit: Payer: Self-pay

## 2022-11-17 ENCOUNTER — Emergency Department
Admission: EM | Admit: 2022-11-17 | Discharge: 2022-11-18 | Disposition: A | Discharge status: Home / Self Care | Payer: Self-pay | Attending: Emergency Medicine | Admitting: Emergency Medicine

## 2022-11-17 ENCOUNTER — Emergency Department: Payer: Self-pay

## 2022-11-17 DIAGNOSIS — M79604 Pain in right leg: Secondary | ICD-10-CM | POA: Insufficient documentation

## 2022-11-17 DIAGNOSIS — I8391 Asymptomatic varicose veins of right lower extremity: Secondary | ICD-10-CM

## 2022-11-17 DIAGNOSIS — I83811 Varicose veins of right lower extremities with pain: Secondary | ICD-10-CM | POA: Insufficient documentation

## 2022-11-17 NOTE — ED Provider Notes (Signed)
Patient signed out to me pending DVT study.

## 2022-11-17 NOTE — ED Triage Notes (Addendum)
Pt comes from home via POV. Pt had bruise that appeared a week ago but pt states he did not injure/strain himself. Pt states it hurts while sitting still and does a "clicking noise" when he moves it. Pt states that "veins are swollen" on thigh.

## 2022-11-17 NOTE — ED Provider Notes (Signed)
[facility] Provider Note  Patient Contact: 10:16 PM (approximate)   History   Knee Pain   HPI  Scott Winters is a 46 y.o. male presents emergency department complaining of areas of "swollen veins" and painful leg to the right leg.  Patient has had some areas of nontraumatic ecchymosis as well as some "bulging veins" to the medial right thigh.  Symptoms have been ongoing for roughly a month.  No erythema, gross edema of the thigh is reported.  No bleeding or clotting history.  No trauma to the leg.     Physical Exam   Triage Vital Signs: ED Triage Vitals  Enc Vitals Group     BP 11/17/22 2152 (!) 139/91     Pulse Rate 11/17/22 2152 88     Resp 11/17/22 2152 19     Temp 11/17/22 2152 98.4 F (36.9 C)     Temp Source 11/17/22 2152 Oral     SpO2 11/17/22 2152 95 %     Weight 11/17/22 2153 220 lb (99.8 kg)     Height 11/17/22 2153 5\' 11"  (1.803 m)     Head Circumference --      Peak Flow --      Pain Score 11/17/22 2153 6     Pain Loc --      Pain Edu? --      Excl. in [place]? --     Most recent vital signs: Vitals:   11/17/22 2152  BP: (!) 139/91  Pulse: 88  Resp: 19  Temp: 98.4 F (36.9 C)  SpO2: 95%     General: Alert and in no acute distress.  Cardiovascular:  Good peripheral perfusion Respiratory: Normal respiratory effort without tachypnea or retractions. Lungs CTAB.  Musculoskeletal: Full range of motion to all extremities.  Visualization of the right lower extremity reveals 3 small areas that appear to be varicose veins along the medial thigh.  No gross edema of the thigh.  Patient is tender along the medial thigh.  Pulses sensation intact distally. Neurologic:  No gross focal neurologic deficits are appreciated.  Skin:   No rash noted Other:   ED Results / Procedures / Treatments   Labs (all labs ordered are listed, but only abnormal results are displayed) Labs Reviewed - No data to display   EKG     RADIOLOGY  I  personally viewed, evaluated, and interpreted these images as part of my medical decision making, as well as reviewing the written report by the radiologist.  ED Provider Interpretation:   No results found.  PROCEDURES:  Critical Care performed: No  Procedures   MEDICATIONS ORDERED IN ED: Medications - No data to display   IMPRESSION / MDM / [place] / ED COURSE  I reviewed the triage vital signs and the nursing notes.                              Differential diagnosis includes, but is not limited to, knee strain, DVT, varicose vein, traumatic hematoma  Patient's presentation is most consistent with acute presentation with potential threat to life or bodily function.   Patient arrives with 2 areas to the right leg with the right knee, medial thigh that are painful and slightly swollen.  On exam it appears to be varicose vein, however given the location we will order ultrasound to ensure no evidence of DVT.  Patient is currently awaiting ultrasound.  Patient  care will be transferred to attending provider, Dr. Sidney Ace pending ultrasound results.  Suspect at this time the patient will likely be able to be discharged with NSAIDs and follow-up with vascular surgery if he would like definitive treatment for his varicose vein assuming his US reveals no DVT. Otherwise anticoagulation but still suspect discharge    Note:  This document was prepared using Dragon voice recognition software and may include unintentional dictation errors.   Lanette Hampshire 11/17/22 2331    Georga Hacking, MD 11/18/22 [phone]

## 2022-11-18 NOTE — Discharge Instructions (Signed)
Your ultrasound did not show any blood clot.  Please follow-up with primary care.  Please elevate the leg and use a compression stocking.

## 2022-12-30 ENCOUNTER — Other Ambulatory Visit: Payer: Self-pay

## 2022-12-30 ENCOUNTER — Emergency Department
Admission: EM | Admit: 2022-12-30 | Discharge: 2022-12-30 | Disposition: A | Discharge status: Home / Self Care | Payer: Self-pay | Attending: Emergency Medicine | Admitting: Emergency Medicine

## 2022-12-30 DIAGNOSIS — Z20822 Contact with and (suspected) exposure to covid-19: Secondary | ICD-10-CM | POA: Insufficient documentation

## 2022-12-30 DIAGNOSIS — J101 Influenza due to other identified influenza virus with other respiratory manifestations: Secondary | ICD-10-CM | POA: Insufficient documentation

## 2022-12-30 LAB — RESP PANEL BY RT-PCR (RSV, FLU A&B, COVID)  RVPGX2
Influenza A by PCR: POSITIVE — AB
Influenza B by PCR: NEGATIVE
Resp Syncytial Virus by PCR: NEGATIVE
SARS Coronavirus 2 by RT PCR: NEGATIVE

## 2022-12-30 NOTE — ED Triage Notes (Signed)
Patient to ED for diarrhea since yesterday with cough and sinus pressure with cold chills.

## 2022-12-30 NOTE — ED Provider Triage Note (Signed)
Emergency Medicine Provider Triage Evaluation Note  Tavion Senkbeil , a 47 y.o. male  was evaluated in triage.  Pt complains of sudden onset diarrhea, chills, sweats, nasal congestion and cough last evening.  No known sick exposure.  Review of Systems  Positive: Diarrhea Negative: No vomiting.  Physical Exam  BP (!) 131/99 (BP Location: Left Arm)   Pulse (!) 110   Temp 99.1 F (37.3 C) (Oral)   Resp 18   Ht 5\' 11"  (1.803 m)   Wt 102.1 kg   SpO2 94%   BMI 31.38 kg/m  Gen:   Awake, no distress   Resp:  Normal effort lungs are clear bilaterally. MSK:   Moves extremities without difficulty  Other:    Medical Decision Making  Medically screening exam initiated at 7:55 AM.  Appropriate orders placed.  Marcelo Ickes was informed that the remainder of the evaluation will be completed by another provider, this initial triage assessment does not replace that evaluation, and the importance of remaining in the ED until their evaluation is complete.     Johnn Hai, PA-C 12/30/22 [phone]

## 2022-12-30 NOTE — ED Provider Notes (Signed)
   [facility] Provider Note    None    (approximate)   History   Diarrhea   HPI  Scott Winters is a 47 y.o. male   presents to the ED with complaint of cough, sinus pressure, cold symptoms, chills, possible fever and diarrhea that started yesterday.    Physical Exam   Triage Vital Signs: ED Triage Vitals  Enc Vitals Group     BP 12/30/22 0755 (!) 131/99     Pulse Rate 12/30/22 0755 (!) 110     Resp 12/30/22 0755 18     Temp 12/30/22 0755 99.1 F (37.3 C)     Temp Source 12/30/22 0755 Oral     SpO2 12/30/22 0755 94 %     Weight 12/30/22 0754 225 lb (102.1 kg)     Height 12/30/22 0754 5\' 11"  (1.803 m)     Head Circumference --      Peak Flow --      Pain Score 12/30/22 0753 0     Pain Loc --      Pain Edu? --      Excl. in [place]? --     Most recent vital signs: Vitals:   12/30/22 0755  BP: (!) 131/99  Pulse: (!) 110  Resp: 18  Temp: 99.1 F (37.3 C)  SpO2: 94%     General: Awake, no distress.  Nasal congestion. CV:  Good peripheral perfusion.  Resp:  Normal effort.  Lungs are clear.  Congested cough noted during exam. Abd:  No distention.  Other:     ED Results / Procedures / Treatments   Labs (all labs ordered are listed, but only abnormal results are displayed) Labs Reviewed  RESP PANEL BY RT-PCR (RSV, FLU A&B, COVID)  RVPGX2 - Abnormal; Notable for the following components:      Result Value   Influenza A by PCR POSITIVE (*)    All other components within normal limits      PROCEDURES:  Critical Care performed:   Procedures   MEDICATIONS ORDERED IN ED: Medications - No data to display   IMPRESSION / MDM / [place] / ED COURSE  I reviewed the triage vital signs and the nursing notes.   Differential diagnosis includes, but is not limited to, COVID, influenza, RSV, viral illness.  47 year old male presents to the ED with upper respiratory symptoms and was made aware that his respiratory panel is  positive for influenza A.  Patient was instructed to continue with clear liquids frequently and take Tylenol as needed for body aches, headache or fever.  He will continue with over-the-counter medication that he has already been taking for his symptoms.  Return to the emergency department if any severe worsening of his symptoms.      Patient's presentation is most consistent with acute complicated illness / injury requiring diagnostic workup.  FINAL CLINICAL IMPRESSION(S) / ED DIAGNOSES   Final diagnoses:  Influenza A     Rx / DC Orders   ED Discharge Orders     None        Note:  This document was prepared using Dragon voice recognition software and may include unintentional dictation errors.   Johnn Hai, PA-C 12/30/22 1135    Rada Hay, MD 12/30/22 [phone]

## 2022-12-30 NOTE — Discharge Instructions (Addendum)
Follow up with your primary care provider if any continued problems.  Tylenol if needed for bodyaches, headaches and fever.  Clear liquids for next 24 hours for diarrhea.  Continue with over the counter medication for congestion and cough as needed

## 2023-11-03 ENCOUNTER — Emergency Department
Admission: EM | Admit: 2023-11-03 | Discharge: 2023-11-03 | Disposition: A | Discharge status: Home / Self Care | Payer: Worker's Compensation | Attending: Emergency Medicine | Admitting: Emergency Medicine

## 2023-11-03 ENCOUNTER — Other Ambulatory Visit: Payer: Self-pay

## 2023-11-03 DIAGNOSIS — S6992XA Unspecified injury of left wrist, hand and finger(s), initial encounter: Secondary | ICD-10-CM | POA: Diagnosis present

## 2023-11-03 DIAGNOSIS — S61412A Laceration without foreign body of left hand, initial encounter: Secondary | ICD-10-CM | POA: Diagnosis not present

## 2023-11-03 DIAGNOSIS — W268XXA Contact with other sharp object(s), not elsewhere classified, initial encounter: Secondary | ICD-10-CM | POA: Insufficient documentation

## 2023-11-03 DIAGNOSIS — Y990 Civilian activity done for income or pay: Secondary | ICD-10-CM | POA: Diagnosis not present

## 2023-11-03 DIAGNOSIS — S61211A Laceration without foreign body of left index finger without damage to nail, initial encounter: Secondary | ICD-10-CM | POA: Diagnosis not present

## 2023-11-03 MED ORDER — CEPHALEXIN 500 MG PO CAPS: 500.0000 mg | ORAL_CAPSULE | Freq: Three times a day (TID) | ORAL | 0 refills | Status: AC

## 2023-11-03 MED ORDER — TRAMADOL HCL 50 MG PO TABS: 50.0000 mg | ORAL_TABLET | Freq: Four times a day (QID) | ORAL | 0 refills | Status: AC | PRN

## 2023-11-03 MED ORDER — LIDOCAINE HCL (PF) 1 % IJ SOLN
10.0000 mL | Freq: Once | INTRAMUSCULAR | Status: AC
  Administered 2023-11-03: 10 mL via INTRADERMAL
  Filled 2023-11-03: qty 10

## 2023-11-03 MED ORDER — LIDOCAINE HCL (PF) 1 % IJ SOLN
5.0000 mL | Freq: Once | INTRAMUSCULAR | Status: AC
  Administered 2023-11-03: 5 mL via INTRADERMAL
  Filled 2023-11-03: qty 5

## 2023-11-03 NOTE — ED Provider Notes (Signed)
[facility] Provider Note    Event Date/Time   First MD Initiated Contact with Patient 11/03/23 1107     (approximate)   History   Hand Injury   HPI  Scott Winters is a 47 y.o. male with no significant past medical history presents emergency department complaining of laceration to the left hand.  Patient was at work today, was trying to repair a wood stove when use of the metal slipped and cut his hand.  Tdap is up-to-date.  No other injuries      Physical Exam   Triage Vital Signs: ED Triage Vitals  Encounter Vitals Group     BP 11/03/23 1107 (!) 140/96     Systolic BP Percentile --      Diastolic BP Percentile --      Pulse Rate 11/03/23 1107 89     Resp 11/03/23 1107 18     Temp 11/03/23 1107 98.3 F (36.8 C)     Temp Source 11/03/23 1107 Oral     SpO2 11/03/23 1107 100 %     Weight 11/03/23 1052 225 lb 1.4 oz (102.1 kg)     Height --      Head Circumference --      Peak Flow --      Pain Score 11/03/23 1052 0     Pain Loc --      Pain Education --      Exclude from Growth Chart --     Most recent vital signs: Vitals:   11/03/23 1107  BP: (!) 140/96  Pulse: 89  Resp: 18  Temp: 98.3 F (36.8 C)  SpO2: 100%     General: Awake, no distress.   CV:  Good peripheral perfusion. regular rate and  rhythm Resp:  Normal effort. Lungs cta Abd:  No distention.   Other:  3 lacerations noted, 1 is an avulsion on the left hand, areas are actively bleeding   ED Results / Procedures / Treatments   Labs (all labs ordered are listed, but only abnormal results are displayed) Labs Reviewed - No data to display   EKG     RADIOLOGY     PROCEDURES:   .Marland KitchenLaceration Repair  Date/Time: 11/03/2023 12:50 PM  Performed by: Faythe Ghee, PA-C Authorized by: Faythe Ghee, PA-C   Consent:    Consent obtained:  Verbal   Consent given by:  Patient   Risks, benefits, and alternatives were discussed: yes     Risks discussed:   Infection, pain, tendon damage, poor cosmetic result, retained foreign body, need for additional repair, nerve damage, poor wound healing and vascular damage   Alternatives discussed:  No treatment Universal protocol:    Procedure explained and questions answered to patient or proxy's satisfaction: yes     Immediately prior to procedure, a time out was called: yes     Patient identity confirmed:  Verbally with patient Anesthesia:    Anesthesia method:  Local infiltration   Local anesthetic:  Lidocaine 1% w/o epi Laceration details:    Location:  Hand   Hand location:  L hand, dorsum   Length (cm):  3 Pre-procedure details:    Preparation:  Patient was prepped and draped in usual sterile fashion Exploration:    Limited defect created (wound extended): no     Hemostasis achieved with:  Direct pressure   Wound exploration: wound explored through full range of motion and entire depth of wound visualized  Wound extent: areolar tissue not violated, fascia not violated, no foreign body, no signs of injury, no nerve damage, no tendon damage, no underlying fracture and no vascular damage     Contaminated: no   Treatment:    Area cleansed with:  Povidone-iodine and saline   Amount of cleaning:  Standard   Irrigation solution:  Sterile saline   Irrigation method:  Tap Skin repair:    Repair method:  Sutures   Suture size:  5-0   Suture material:  Nylon   Suture technique:  Simple interrupted   Number of sutures:  9 Approximation:    Approximation:  Close Repair type:    Repair type:  Simple Post-procedure details:    Dressing:  Non-adherent dressing   Procedure completion:  Tolerated well, no immediate complications Comments:     Performed by PA student Scarlette Ar under my supervision .Marland KitchenLaceration Repair  Date/Time: 11/03/2023 12:53 PM  Performed by: Faythe Ghee, PA-C Authorized by: Faythe Ghee, PA-C   Consent:    Consent obtained:  Verbal   Consent given by:   Patient   Risks, benefits, and alternatives were discussed: yes     Risks discussed:  Infection, pain, retained foreign body, tendon damage, poor cosmetic result, need for additional repair, nerve damage, poor wound healing and vascular damage   Alternatives discussed:  Delayed treatment Universal protocol:    Procedure explained and questions answered to patient or proxy's satisfaction: yes     Immediately prior to procedure, a time out was called: yes     Patient identity confirmed:  Verbally with patient Anesthesia:    Anesthesia method:  Nerve block   Block needle gauge:  27 G   Block anesthetic:  Lidocaine 1% w/o epi   Block technique:  Digital   Block injection procedure:  Anatomic landmarks identified, introduced needle, incremental injection, anatomic landmarks palpated and negative aspiration for blood   Block outcome:  Anesthesia achieved Laceration details:    Location:  Finger   Finger location:  L index finger   Length (cm):  5 Pre-procedure details:    Preparation:  Patient was prepped and draped in usual sterile fashion Exploration:    Limited defect created (wound extended): no     Hemostasis achieved with:  Direct pressure   Wound exploration: wound explored through full range of motion and entire depth of wound visualized     Wound extent: areolar tissue not violated, fascia not violated, no foreign body, no signs of injury, no nerve damage, no tendon damage, no underlying fracture and no vascular damage     Contaminated: no   Treatment:    Area cleansed with:  Povidone-iodine and saline   Amount of cleaning:  Standard   Irrigation solution:  Sterile saline   Irrigation method:  Tap Skin repair:    Repair method:  Sutures   Suture size:  5-0   Suture material:  Nylon   Suture technique:  Simple interrupted   Number of sutures:  11 Approximation:    Approximation:  Close Repair type:    Repair type:  Simple Post-procedure details:    Dressing:  Non-adherent  dressing   Procedure completion:  Tolerated well, no immediate complications    MEDICATIONS ORDERED IN ED: Medications  lidocaine (PF) (XYLOCAINE) 1 % injection 10 mL (10 mLs Intradermal Given by Other 11/03/23 1242)  lidocaine (PF) (XYLOCAINE) 1 % injection 5 mL (5 mLs Intradermal Given by Other 11/03/23 1243)  IMPRESSION / MDM / ASSESSMENT AND PLAN / ED COURSE  I reviewed the triage vital signs and the nursing notes.                              Differential diagnosis includes, but is not limited to, laceration, abrasion, avulsion, tendon injury  Patient's presentation is most consistent with acute complicated illness / injury requiring diagnostic workup.   The patient has full range of motion of all fingers, I do not feel that there is a laceration to his tendon, there is a laceration to the left index finger on the dorsum, laceration in the middle of the hand on the dorsum, and a avulsion on the dorsum of the left hand   See procedure notes for laceration repairs.  Patient did tolerate procedure well.  Due to the amount of metal dust on his hands etc. we will go ahead and place him on antibiotic.  He was placed on Keflex 500 3 times daily for 7 days.  Tramadol for pain if needed.  He was given 8 pills.  Did caution him that he should take Tylenol first.  If that is controlling the pain he should not use the pain medication.  Also he cannot mix alcohol with pain medication.  He and his wife are in agreement with treatment plan.  Patient was discharged stable condition.  Drug screen was obtained by nursing staff.   FINAL CLINICAL IMPRESSION(S) / ED DIAGNOSES   Final diagnoses:  Laceration of left hand without foreign body, initial encounter     Rx / DC Orders   ED Discharge Orders          Ordered    cephALEXin (KEFLEX) 500 MG capsule  3 times daily        11/03/23 1229    traMADol (ULTRAM) 50 MG tablet  Every 6 hours PRN        11/03/23 1229             Note:   This document was prepared using Dragon voice recognition software and may include unintentional dictation errors.    Faythe Ghee, PA-C 11/03/23 1300    Sharyn Creamer, MD 11/03/23 [phone]

## 2023-11-03 NOTE — ED Notes (Signed)
Dressing applied to patient's L hand.

## 2023-11-03 NOTE — ED Triage Notes (Signed)
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  Three wounds to left posterior hand.  Bleeding controlled.  DSD applied.
